# Patient Record
Sex: Female | Born: 1970 | Race: White | Hispanic: Yes | Smoking: Never smoker
Health system: Southern US, Community
[De-identification: ages and names within clinical notes are randomized; demographics above are authoritative.]

## PROBLEM LIST (undated history)

## (undated) DIAGNOSIS — R011 Cardiac murmur, unspecified: Secondary | ICD-10-CM

## (undated) HISTORY — DX: Cardiac murmur, unspecified: R01.1

---

## 1998-09-30 ENCOUNTER — Emergency Department (HOSPITAL_COMMUNITY): Admission: EM | Admit: 1998-09-30 | Discharge: 1998-09-30 | Payer: Self-pay | Admitting: Emergency Medicine

## 1998-10-01 ENCOUNTER — Encounter: Payer: Self-pay | Admitting: Emergency Medicine

## 1998-12-08 ENCOUNTER — Inpatient Hospital Stay (HOSPITAL_COMMUNITY): Admission: AD | Admit: 1998-12-08 | Discharge: 1998-12-08 | Payer: Self-pay | Admitting: Obstetrics & Gynecology

## 1999-02-04 ENCOUNTER — Other Ambulatory Visit: Admission: RE | Admit: 1999-02-04 | Discharge: 1999-02-04 | Payer: Self-pay | Admitting: Gynecology

## 2000-04-28 ENCOUNTER — Other Ambulatory Visit: Admission: RE | Admit: 2000-04-28 | Discharge: 2000-04-28 | Payer: Self-pay | Admitting: Internal Medicine

## 2001-07-17 ENCOUNTER — Other Ambulatory Visit: Admission: RE | Admit: 2001-07-17 | Discharge: 2001-07-17 | Payer: Self-pay | Admitting: Gynecology

## 2002-05-02 ENCOUNTER — Other Ambulatory Visit: Admission: RE | Admit: 2002-05-02 | Discharge: 2002-05-02 | Payer: Self-pay | Admitting: Gynecology

## 2002-05-27 ENCOUNTER — Inpatient Hospital Stay (HOSPITAL_COMMUNITY): Admission: AD | Admit: 2002-05-27 | Discharge: 2002-05-27 | Payer: Self-pay | Admitting: Gynecology

## 2002-05-29 ENCOUNTER — Encounter (INDEPENDENT_AMBULATORY_CARE_PROVIDER_SITE_OTHER): Payer: Self-pay | Admitting: *Deleted

## 2002-05-29 ENCOUNTER — Ambulatory Visit (HOSPITAL_COMMUNITY): Admission: RE | Admit: 2002-05-29 | Discharge: 2002-05-29 | Payer: Self-pay | Admitting: Gynecology

## 2002-06-03 ENCOUNTER — Inpatient Hospital Stay (HOSPITAL_COMMUNITY): Admission: AD | Admit: 2002-06-03 | Discharge: 2002-06-03 | Payer: Self-pay | Admitting: Gynecology

## 2002-06-07 ENCOUNTER — Ambulatory Visit (HOSPITAL_COMMUNITY): Admission: RE | Admit: 2002-06-07 | Discharge: 2002-06-07 | Payer: Self-pay | Admitting: Gynecology

## 2002-06-07 ENCOUNTER — Encounter (INDEPENDENT_AMBULATORY_CARE_PROVIDER_SITE_OTHER): Payer: Self-pay | Admitting: Specialist

## 2002-12-06 ENCOUNTER — Ambulatory Visit (HOSPITAL_COMMUNITY): Admission: RE | Admit: 2002-12-06 | Discharge: 2002-12-06 | Payer: Self-pay | Admitting: Cardiovascular Disease

## 2003-05-21 ENCOUNTER — Other Ambulatory Visit: Admission: RE | Admit: 2003-05-21 | Discharge: 2003-05-21 | Payer: Self-pay | Admitting: Gynecology

## 2003-12-07 ENCOUNTER — Inpatient Hospital Stay (HOSPITAL_COMMUNITY): Admission: AD | Admit: 2003-12-07 | Discharge: 2003-12-09 | Payer: Self-pay | Admitting: Gynecology

## 2004-01-23 ENCOUNTER — Other Ambulatory Visit: Admission: RE | Admit: 2004-01-23 | Discharge: 2004-01-23 | Payer: Self-pay | Admitting: Gynecology

## 2005-02-04 ENCOUNTER — Other Ambulatory Visit: Admission: RE | Admit: 2005-02-04 | Discharge: 2005-02-04 | Payer: Self-pay | Admitting: Gynecology

## 2006-02-15 ENCOUNTER — Other Ambulatory Visit: Admission: RE | Admit: 2006-02-15 | Discharge: 2006-02-15 | Payer: Self-pay | Admitting: Gynecology

## 2006-03-10 ENCOUNTER — Ambulatory Visit (HOSPITAL_COMMUNITY): Admission: RE | Admit: 2006-03-10 | Discharge: 2006-03-10 | Payer: Self-pay | Admitting: Gynecology

## 2007-07-11 ENCOUNTER — Inpatient Hospital Stay (HOSPITAL_COMMUNITY): Admission: AD | Admit: 2007-07-11 | Discharge: 2007-07-13 | Payer: Self-pay | Admitting: Obstetrics and Gynecology

## 2008-05-21 ENCOUNTER — Encounter (HOSPITAL_COMMUNITY): Admission: RE | Admit: 2008-05-21 | Discharge: 2008-05-28 | Payer: Self-pay | Admitting: Family Medicine

## 2008-10-09 ENCOUNTER — Encounter: Admission: RE | Admit: 2008-10-09 | Discharge: 2008-10-09 | Payer: Self-pay | Admitting: Family Medicine

## 2008-12-11 ENCOUNTER — Ambulatory Visit: Payer: Self-pay | Admitting: Internal Medicine

## 2008-12-11 ENCOUNTER — Encounter (INDEPENDENT_AMBULATORY_CARE_PROVIDER_SITE_OTHER): Payer: Self-pay | Admitting: Surgery

## 2008-12-11 ENCOUNTER — Ambulatory Visit (HOSPITAL_COMMUNITY): Admission: RE | Admit: 2008-12-11 | Discharge: 2008-12-12 | Payer: Self-pay | Admitting: Surgery

## 2010-06-21 ENCOUNTER — Encounter: Payer: Self-pay | Admitting: Family Medicine

## 2010-09-06 LAB — COMPREHENSIVE METABOLIC PANEL
ALT: 13 U/L (ref 0–35)
ALT: 25 U/L (ref 0–35)
AST: 18 U/L (ref 0–37)
AST: 31 U/L (ref 0–37)
Albumin: 3.4 g/dL — ABNORMAL LOW (ref 3.5–5.2)
Alkaline Phosphatase: 49 U/L (ref 39–117)
CO2: 23 mEq/L (ref 19–32)
CO2: 27 mEq/L (ref 19–32)
Calcium: 9.1 mg/dL (ref 8.4–10.5)
Chloride: 105 mEq/L (ref 96–112)
Chloride: 108 mEq/L (ref 96–112)
Creatinine, Ser: 0.59 mg/dL (ref 0.4–1.2)
GFR calc Af Amer: >60 mL/min (ref >60)
GFR calc Af Amer: >60 mL/min (ref >60)
GFR calc non Af Amer: >60 mL/min (ref >60)
GFR calc non Af Amer: >60 mL/min (ref >60)
Glucose, Bld: 91 mg/dL (ref 70–99)
Potassium: 4.5 mEq/L (ref 3.5–5.1)
Sodium: 140 mEq/L (ref 135–145)
Total Bilirubin: 0.6 mg/dL (ref 0.3–1.2)
Total Bilirubin: 0.7 mg/dL (ref 0.3–1.2)

## 2010-09-06 LAB — CBC
Hemoglobin: 13.5 g/dL (ref 12.0–15.0)
MCHC: 33.9 g/dL (ref 30.0–36.0)
MCV: 91.8 fL (ref 78.0–100.0)
Platelets: 189 10*3/uL (ref 150–400)
RBC: 4.11 MIL/uL (ref 3.87–5.11)
RBC: 4.33 MIL/uL (ref 3.87–5.11)
WBC: 4.5 10*3/uL (ref 4.0–10.5)
WBC: 7.5 10*3/uL (ref 4.0–10.5)

## 2010-09-06 LAB — DIFFERENTIAL
Basophils Absolute: 0 10*3/uL (ref 0.0–0.1)
Eosinophils Absolute: 0 10*3/uL (ref 0.0–0.7)
Eosinophils Relative: 1 % (ref 0–5)
Lymphocytes Relative: 39 % (ref 12–46)
Neutrophils Relative %: 53 % (ref 43–77)

## 2010-10-13 NOTE — Discharge Summary (Signed)
Katherine Bird, Katherine Bird          ACCOUNT NO.:  [id number]   MEDICAL RECORD NO.:  [id number]          PATIENT TYPE:  INP   LOCATION:  9125                          FACILITY:  [facility]   PHYSICIAN:  Gerrit Friends. Aldona Bar, M.D.   DATE OF BIRTH:  Jan 17, 1971   DATE OF ADMISSION:  07/11/2007  DATE OF DISCHARGE:  07/13/2007                               DISCHARGE SUMMARY   DISCHARGE DIAGNOSES:  1. Term pregnancy, delivered 7 pound 6 ounce female infant, Apgars 9 and      9.  2. Blood type A+.   PROCEDURES:  1. Normal spontaneous delivery.  2. Second-degree tear and repair.   SUMMARY:  This 40 year old gravida 3, para 1 presented at term in labor  after a relatively benign pregnancy.  She requested and received an  epidural and progressed nicely.  Subsequently had a normal spontaneous  delivery of a viable 7 pound 6 ounce female infant with Apgars of 9 and 9  over a second-degree tear which was repaired without difficulty.  Her  postpartum course was benign.  Her discharge hemoglobin was 10.6 with a  white count of 8900, platelet count of 165,000.  On the morning of  July 13, 2007, she was ambulating well, tolerating a regular diet  well, having normal bowel and bladder function, was breast-feeding and  bottle feeding, and was comfortable.  Vital signs were stable and was  desirous of discharge.  Baby was doing well.   DISCHARGE INSTRUCTIONS:  1. She was given all appropriate instructions per discharge brochure      and understood all instructions well.  2. She will return to the office as mentioned in 4 weeks' time or as      needed.   DISCHARGE MEDICATIONS:  1. Vitamins 1 a day as long she is breast-feeding.  2. Feosol capsules 1 a day until her followup in the office in 4      weeks' time.  3. She was given a prescription for Motrin to use 600 mg every 6 hours      for cramping or pain.   CONDITION ON DISCHARGE:  Improved.      Gerrit Friends. Aldona Bar, M.D.  Electronically Signed     RMW/MEDQ  D:  07/13/2007  T:  07/14/2007  Job:  [id number]

## 2010-10-13 NOTE — Op Note (Signed)
Katherine Bird, Katherine Bird          ACCOUNT NO.:  [id number]   MEDICAL RECORD NO.:  [id number]          PATIENT TYPE:  AMB   LOCATION:  DAY                          FACILITY:  [facility]   PHYSICIAN:  Wilmon Arms. Corliss Skains, M.D. DATE OF BIRTH:  Aug 01, 1970   DATE OF PROCEDURE:  12/11/2008  DATE OF DISCHARGE:                               OPERATIVE REPORT   PREOPERATIVE DIAGNOSIS:  Chronic calculous cholecystitis.   POSTOPERATIVE DIAGNOSIS:  Chronic calculous cholecystitis.   PROCEDURE PERFORMED:  Laparoscopic cholecystectomy with intraoperative  cholangiogram.   SURGEON:  Wilmon Arms. Tsuei, M.D.   ANESTHESIA:  General.   INDICATIONS:  This is a 40 year old female who presents with a history  of a severe episode of right upper quadrant abdominal pain associated  with nausea and vomiting.  She has had several milder tacks.  Ultrasound  showed multiple gallstones, the largest measuring 9 mm.  Common bile  duct was prominent.  Preoperative liver function tests however showed a  bilirubin of 0.7.  She presents now for cholecystectomy.   DESCRIPTION OF PROCEDURE:  The patient was brought to the operating and  placed in the supine position on the operating room table.  After  adequate level of general anesthesia was obtained, the patient's abdomen  prepped with ChloraPrep and draped in a sterile fashion.  Time-out was  taken to assure proper patient and proper procedure.  We infiltrated the  area below the umbilicus with 0.25% Marcaine with epinephrine.  A  transverse incision was made.  Dissection was carried down to the  subcutaneous tissues to the fascia.  The fascia was opened vertically.  We bluntly entered the peritoneal cavity.  A stay suture of Vicryl was  placed on the fascial opening.  The Hasson cannula was inserted and  secured with a stay suture.  Pneumoperitoneum was obtained by  insufflating CO2 maintaining maximal pressure of 15 mmHg.  The  laparoscope was inserted and the patient  was positioned in reverse  Trendelenburg tilted to her left.  An 11-mm port was placed in the  subxiphoid position.  Two 5 mm ports were placed in the right upper  quadrant.  The gallbladder was grasped with the clamp and elevated.  There were some flimsy adhesions around the hilum of the gallbladder and  these were taken down bluntly.  We opened the peritoneum around the  hilum of the gallbladder and bluntly dissected around the cystic duct  and the cystic artery.  The cystic artery was ligated with clips and  divided.  The cystic duct was ligated and clipped distally.  A small  opening was created on the cystic duct.  A Cook cholangiogram catheter  was inserted through a stab incision threaded in the cystic duct.  A  cholangiogram was then obtained which showed good flow of contrast  proximally and distally in the biliary tree.  The common bile duct did  seem rather large in size.  There were several filling defects within  the distal common bile duct which were consistent with stones.  This was  confirmed by radiology.  However, contrast did flow into the duodenum.  The  catheter was removed and the cystic duct was ligated with three  clips and divided.  Cautery was then used to remove the gallbladder from  the liver.  The gallbladder was placed in an EndoCatch sac.  We  thoroughly cauterized the gallbladder fossa for hemostasis.  We closed  the umbilical fascia with  the pursestring suture.  Pneumoperitoneum was then released as we  removed the remained of the trocars.  4-0 Monocryl was used to close the  skin incisions.  Steri-Strips and clean dressings were applied.  The  patient was extubated and brought to recovery in stable condition.  All  sponge, instrument, and needle counts were correct.      Wilmon Arms. Tsuei, M.D.  Electronically Signed     MKT/MEDQ  D:  12/11/2008  T:  12/11/2008  Job:  [id number]

## 2010-10-16 NOTE — Op Note (Signed)
   NAME:  GERALDY, AKRIDGE                    ACCOUNT NO.:  [id number]   MEDICAL RECORD NO.:  [id number]                   PATIENT TYPE:  AMB   LOCATION:  SDC                                  FACILITY:  [facility]   PHYSICIAN:  Katy Fitch, M.D.               DATE OF BIRTH:  Nov 23, 1970   DATE OF PROCEDURE:  05/29/2002  DATE OF DISCHARGE:                                 OPERATIVE REPORT   PREOPERATIVE DIAGNOSES:  Six week missed abortion.   POSTOPERATIVE DIAGNOSES:  Six week missed abortion.   PROCEDURE:  Suction dilatation and curettage.   SURGEON:  Katy Fitch, M.D.   ANESTHESIA:  MAC.   ESTIMATED BLOOD LOSS:  Minimal.   URINE OUTPUT:  250 and clear.   FLUIDS:  600 crystalloid.   COMPLICATIONS:  None.   SPECIMENS:  Products of conception.   FINDINGS:  A 6 week sized uterus.   PROCEDURE:  The patient was taken to the operating room where MAC anesthesia  was administered.  The patient was prepped and draped in a normal sterile  fashion.  Red rubber catheter was introduced in the bladder and  approximately 250 cc of clear urine was returned.  Graves' speculum was  placed into the vagina.  Anterior lip of the cervix grasped with single  tooth tenaculum.  A paracervical block was then performed using 2% lidocaine  without epinephrine.  The uterus was then sounded to approximately 7 cm.  The cervix was then serially dilated to approximately 24 [place].  A number 7  tip curved was then placed in the uterine fundus and two passes of the  suction were performed with products noted to be removed.  A sharp curette  was then used to obtain a gritty texture in the uterus.  One final pass of  the suction curette was then performed.  Bleeding was minimal.  The single  tooth tenaculum was removed along with the Graves' speculum and patient was  taken to recovery room in stable condition.                                               Katy Fitch, M.D.    DC/MEDQ  D:   05/29/2002  T:  05/29/2002  Job:  [id number]

## 2010-10-16 NOTE — Discharge Summary (Signed)
NAME:  Katherine Bird, Katherine Bird                    ACCOUNT NO.:  [id number]   MEDICAL RECORD NO.:  [id number]                   PATIENT TYPE:  INP   LOCATION:  9128                                 [facility]ACILITY:  [facility]   PHYSICIAN:  Juan H. Lily Peer, M.D.             DATE OF BIRTH:  13-Oct-1970   DATE OF ADMISSION:  12/07/2003  DATE OF DISCHARGE:  12/09/2003                                 DISCHARGE SUMMARY   HISTORY OF PRESENT ILLNESS:  The patient is a 40 year old gravida 2, para 0,  abortus 1, who was admitted on March 9 complaining of onset of labor that  day.  She was found to be contracting every 2-3 minutes apart with a  reassuring fetal heart rate tracing and occasional decelerations and her  cervix was found to be 5 cm, 90% effaced, 0 station.  She underwent  artificial rupture of membranes.  She also has a history of VSD and was  placed on ampicillin IV for prophylaxis.  She had amnioinfusion,  subsequently went on to rapidly deliver at 6:12 p.m. on that day a viable  female infant with Apgars of 8 and 9 with weight of 7 pounds 3 ounces.  Arterial cord pH was 7.27 and the patient had sustained a fourth degree tear  repair in standard repair fashion.  Postpartum day #1, her hemoglobin was  11.9, blood type A positive, rubella immune.  On her second postoperative  day, she continued to remain afebrile and was ready to be discharged home.  Her fundal height was two fingerbreadths under the umbilicus.  The uterus  was firm.  The lochia was scant.   DISCHARGE MEDICATIONS:  1. Motrin 800 mg t.i.d. p.r.n.  2. Tylox 1-2 tablets p.o. q.4-6h. p.r.n. pain.  3. She was instructed to continue her prenatal vitamins.   FOLLOWUP:  She was to follow up in [facility] office  in 6 weeks, and instruction booklet was provided.  All of the above was  discussed with the patient and she will follow up accordingly.                                               Juan H. Lily Peer,  M.D.    JHF/MEDQ  D:  12/09/2003  T:  12/09/2003  Job:  [id number]

## 2010-10-16 NOTE — Consult Note (Signed)
   NAME:  Katherine Bird, Katherine Bird                    ACCOUNT NO.:  [id number]   MEDICAL RECORD NO.:  [id number]                   PATIENT TYPE:  MAT   LOCATION:  MATC                                 [facility]   PHYSICIAN:  Juan H. Lily Peer, M.D.             DATE OF BIRTH:  Mar 31, 1971   DATE OF CONSULTATION:  DATE OF DISCHARGE:                                   CONSULTATION   HISTORY OF PRESENT ILLNESS:  The patient is a 40 year old Gravida I, Para 0  who's estimated gestational age is 7 weeks by ultrasound done in the office.  The patient's ultrasound last week was done due to the fact that fetal heart  tones were not auscultated at the 10 to 12 week window. Her records are not  available at this time but from my recollection, she had an irregular  gestational sac at that point but she was concerned of whether her dates  were incorrect or not and wanted to wait until next week to get an  ultrasound to confirm indeed that she has a missed AB.   HOSPITAL COURSE:  She presented to [facility] today complaining of a  dark brownish discharge. No cramping. No fever, chills, nausea and vomiting.  Speculum examination demonstrated some old blood in the vaginal area but the  cervix was closed and there was no active bleeding. We optioned her to have  an ultrasound today. She stated that she preferred to wait until next week  to give a few more days and to re-confirm indeed, that she does have a  missed AB. She was instructed to return to the office or to the emergency  room if she starts having bright red bleeding.   LABORATORY DATA:  Her CBC today demonstrated WBC count of 6.9, hemoglobin  and hematocrit 13.5 and 39.3 respectively with platelet count of 222,000.  Blood type was A+.   DISPOSITION:  All instructions were provided to the patient in Spanish.  Otherwise, we will plan to follow-up with the ultrasound next week and once  again, if her dates are still correct and there is  evidence of a missed AB,  then she will agree to proceed with a D&E.                                               Juan H. Lily Peer, M.D.    JHF/MEDQ  D:  05/27/2002  T:  05/27/2002  Job:  [id number]

## 2010-10-16 NOTE — H&P (Signed)
NAME:  Katherine Bird, Katherine Bird                    ACCOUNT NO.:  [id number]   MEDICAL RECORD NO.:  [id number]                   PATIENT TYPE:  AMB   LOCATION:  SDC                                  [facility]   PHYSICIAN:  Juan H. Lily Peer, M.D.             DATE OF BIRTH:  Oct 24, 1970   DATE OF ADMISSION:  06/07/2002  DATE OF DISCHARGE:                                HISTORY & PHYSICAL   CHIEF COMPLAINT:  Retained products of conception.   HISTORY OF PRESENT ILLNESS:  The patient is a 40 year old gravida 1 para 0  whose last menstrual period had been February 24, 2002 and estimated date  of confinement was to be December 01, 2002.  The patient had been seen at  [facility] for a prenatal visit.  No fetal heart tones  appreciated at the 12-week visit and she opted to wait an additional week  before a D&E.  To reconfirm she had another ultrasound which was done on  December 30, reconfirming once again that there was a gestational sac,  empty, consistent with a blighted ovum.  She subsequently had undergone a  suction D&E on December 30 but the patient report demonstrated inflamed  decidua without evidence of villi on evaluation.  She returned back to the  office.  Interesting enough, her quantitative beta hCGs have remained more  or less leveled off; the one on January 2 was 6060 and on January 5 was  5644.  She had an ultrasound done in the office on January 6 which  demonstrated a uterus measuring 10.5 x 5.1 x 6.7 cm with irregular shaped  sac, mid cavity measuring  28 x 9 x 27 mm thickened decidual reaction, increased vascular flow to the  anterior endometrial wall, and linear echogenic focus in mid sac.  The right  and left ovaries were normal with the exception of a small corpus luteum  cyst on the right ovary measuring 16 x 15 mm.  There was no fluid in the cul-  de-sac.  Findings were consistent with retained products of conception.  The  patient's blood type  is A positive.  She is being taken back to the  operating room for a suction D&E.   PAST MEDICAL HISTORY:  As described above.  She denies any allergies.  She  has otherwise been healthy.   REVIEW OF SYSTEMS:  Essentially unremarkable with the exception that she did  have a systolic ejection murmur 3/6 that had not been noted during her  pregnancy, and subsequently had been referred to a cardiologist here in  [place], of which no report was returned back on written form but her  husband stated that there was no abnormality on echocardiogram.  Still at  this time we are waiting for a consultation report from the cardiologist.  She has otherwise been asymptomatic.   PHYSICAL EXAMINATION:  GENERAL:  Well-developed, well-nourished female.  HEENT:  Unremarkable.  NECK:  Supple, trachea midline.  No carotid bruits, no thyromegaly.  HEART:  A grade 3/6 systolic ejection murmur, precordial region.  BREASTS:  Not examined.  ABDOMEN:  Soft, nontender, without rebound or guarding.  PELVIC:  Bartholin's, urethral, Skene glands within normal limits.  Vagina  with dark red blood in the vault.  Cervix minimally dilated, no active  bleeding noted.  Uterus approximately 10 weeks size, no palpable adnexal  masses, no rebound or tenderness.  RECTAL:  Exam was not done.   PRENATAL LABORATORY DATA:  Blood type A positive, negative antibody screen.  VDRL nonreactive.  Hepatitis B surface antigen and HIV were negative.  Rubella was immune.   ASSESSMENT:  A 40 year old gravida 1 para 0 at 12-and-a-half weeks by last  menstrual period, 10 weeks by ultrasound measurement of the uterus;  persistent gestational sac with products of conception despite previous D&E;  and pathology report demonstrating no chorionic villi.  The patient is being  taken to the operating room for a D&E secondary to retained products of  conception.  Blood type is A positive.  Risks, benefits, pros, and cons of  procedure were  discussed, and all this was explained to the patient and her  husband as well in [place].   PLAN:  Suction D&E today, June 07, 2002 at [facility].                                               [place]. Lily Peer, M.D.    JHF/MEDQ  D:  06/07/2002  T:  06/07/2002  Job:  [id number]

## 2010-10-16 NOTE — Op Note (Signed)
   NAME:  Katherine Bird, Katherine Bird                    ACCOUNT NO.:  [id number]   MEDICAL RECORD NO.:  [id number]                   PATIENT TYPE:  AMB   LOCATION:  SDC                                  [facility]   PHYSICIAN:  Juan H. Lily Peer, M.D.             DATE OF BIRTH:  05-19-1971   DATE OF PROCEDURE:  06/07/2002  DATE OF DISCHARGE:                                 OPERATIVE REPORT   SURGEON:  Juan H. Lily Peer, M.D.   INDICATION FOR OPERATION:  A 36 year old gravida 1, para 0 who had a first  trimester missed AB and subsequently had undergone a D&C on December 30.  Pathology report demonstrated no products of conception.  Follow-up  ultrasound in the office had demonstrated retained products of conception.  The patient's blood type A+.   PREOPERATIVE DIAGNOSES:  Retained products of conception.   POSTOPERATIVE DIAGNOSES:  Retained products of conception.   ANESTHESIA:  MAC along with a paracervical block consisting of 2% Xylocaine  with 1:100,000 epinephrine.   PROCEDURE PERFORMED:  Dilatation and evacuation.   DESCRIPTION OF OPERATION:  After the patient was adequately counseled, she  was taken to the operating room.  She underwent intravenous sedation.  She  was placed in low lithotomy position.  The bladder was evacuated of its  contents for approximately 50 cubic centimeters.  Bimanual examination  demonstrated 10 week sized uterus, anteverted with no palpable adnexal  masses.  The vagina and perineum were prepped and draped in the usual  sterile fashion.  Graves' speculum was inserted into the vaginal vault.  2%  Xylocaine with 1:100,000 epinephrine was infiltrated into the cervical  stroma at the 2, 4, 8, and 10 o'clock position.  The cervix was serially  dilated to a 27 mm size Pratt dilator followed by insertion of the 7 mm  suction curette.  This was interchanged several times with the serrated  curette to remove the products of conception.  The patient received 2  g of  Cefotan IV.  Received 30 mg of Toradol en route to the recovery room.  Vital  signs were stable.  Blood loss was less than 50 cubic centimeters.  Postoperative in-and-out catheter approximately 15 cubic centimeters of  clear urine.  The patient's blood type is A+.                                               Juan H. Lily Peer, M.D.    JHF/MEDQ  D:  06/07/2002  T:  06/07/2002  Job:  [id number]

## 2011-01-28 ENCOUNTER — Other Ambulatory Visit (HOSPITAL_COMMUNITY): Payer: Self-pay | Admitting: Obstetrics and Gynecology

## 2011-02-19 LAB — CBC
Hemoglobin: 13.2
MCHC: 34.4
MCV: 90.5
Platelets: 165
Platelets: 195
RDW: 14.3
WBC: 8.9

## 2011-02-19 LAB — RPR: RPR Ser Ql: NONREACTIVE

## 2011-03-09 ENCOUNTER — Other Ambulatory Visit (HOSPITAL_COMMUNITY): Payer: Self-pay | Admitting: Obstetrics and Gynecology

## 2011-03-09 DIAGNOSIS — Z1231 Encounter for screening mammogram for malignant neoplasm of breast: Secondary | ICD-10-CM

## 2011-03-24 ENCOUNTER — Ambulatory Visit (HOSPITAL_COMMUNITY)
Admission: RE | Admit: 2011-03-24 | Discharge: 2011-03-24 | Disposition: A | Discharge status: Home / Self Care | Payer: Self-pay | Source: Ambulatory Visit | Attending: Obstetrics and Gynecology | Admitting: Obstetrics and Gynecology

## 2011-03-24 DIAGNOSIS — Z1231 Encounter for screening mammogram for malignant neoplasm of breast: Secondary | ICD-10-CM | POA: Insufficient documentation

## 2011-04-02 ENCOUNTER — Other Ambulatory Visit: Payer: Self-pay | Admitting: Obstetrics and Gynecology

## 2011-04-02 DIAGNOSIS — R928 Other abnormal and inconclusive findings on diagnostic imaging of breast: Secondary | ICD-10-CM

## 2011-04-07 ENCOUNTER — Other Ambulatory Visit: Payer: Self-pay | Admitting: Obstetrics and Gynecology

## 2011-04-15 ENCOUNTER — Other Ambulatory Visit: Payer: Self-pay

## 2011-05-03 ENCOUNTER — Ambulatory Visit
Admission: RE | Admit: 2011-05-03 | Discharge: 2011-05-03 | Disposition: A | Discharge status: Home / Self Care | Payer: No Typology Code available for payment source | Source: Ambulatory Visit | Attending: Obstetrics and Gynecology | Admitting: Obstetrics and Gynecology

## 2011-05-03 DIAGNOSIS — R928 Other abnormal and inconclusive findings on diagnostic imaging of breast: Secondary | ICD-10-CM

## 2012-02-11 ENCOUNTER — Other Ambulatory Visit: Payer: Self-pay | Admitting: Obstetrics and Gynecology

## 2012-02-11 DIAGNOSIS — D249 Benign neoplasm of unspecified breast: Secondary | ICD-10-CM

## 2012-02-17 ENCOUNTER — Ambulatory Visit
Admission: RE | Admit: 2012-02-17 | Discharge: 2012-02-17 | Disposition: A | Discharge status: Home / Self Care | Payer: Self-pay | Source: Ambulatory Visit | Attending: Obstetrics and Gynecology | Admitting: Obstetrics and Gynecology

## 2012-02-17 DIAGNOSIS — D249 Benign neoplasm of unspecified breast: Secondary | ICD-10-CM

## 2012-07-27 ENCOUNTER — Other Ambulatory Visit: Payer: Self-pay

## 2012-09-21 ENCOUNTER — Other Ambulatory Visit: Payer: Self-pay | Admitting: Obstetrics and Gynecology

## 2012-09-21 DIAGNOSIS — N649 Disorder of breast, unspecified: Secondary | ICD-10-CM

## 2012-10-04 ENCOUNTER — Ambulatory Visit
Admission: RE | Admit: 2012-10-04 | Discharge: 2012-10-04 | Disposition: A | Discharge status: Home / Self Care | Payer: No Typology Code available for payment source | Source: Ambulatory Visit | Attending: Obstetrics and Gynecology | Admitting: Obstetrics and Gynecology

## 2012-10-04 ENCOUNTER — Ambulatory Visit
Admission: RE | Admit: 2012-10-04 | Discharge: 2012-10-04 | Disposition: A | Discharge status: Home / Self Care | Payer: Self-pay | Source: Ambulatory Visit | Attending: Obstetrics and Gynecology | Admitting: Obstetrics and Gynecology

## 2012-10-04 DIAGNOSIS — N649 Disorder of breast, unspecified: Secondary | ICD-10-CM

## 2013-05-03 ENCOUNTER — Other Ambulatory Visit: Payer: Self-pay | Admitting: Obstetrics and Gynecology

## 2013-05-03 DIAGNOSIS — N649 Disorder of breast, unspecified: Secondary | ICD-10-CM

## 2013-05-10 ENCOUNTER — Ambulatory Visit
Admission: RE | Admit: 2013-05-10 | Discharge: 2013-05-10 | Disposition: A | Discharge status: Home / Self Care | Payer: No Typology Code available for payment source | Source: Ambulatory Visit | Attending: Obstetrics and Gynecology | Admitting: Obstetrics and Gynecology

## 2013-05-10 DIAGNOSIS — N649 Disorder of breast, unspecified: Secondary | ICD-10-CM

## 2014-01-10 ENCOUNTER — Other Ambulatory Visit: Payer: Self-pay

## 2014-01-11 ENCOUNTER — Other Ambulatory Visit: Payer: Self-pay

## 2014-01-11 DIAGNOSIS — Z1231 Encounter for screening mammogram for malignant neoplasm of breast: Secondary | ICD-10-CM

## 2014-01-23 ENCOUNTER — Ambulatory Visit
Admission: RE | Admit: 2014-01-23 | Discharge: 2014-01-23 | Disposition: A | Discharge status: Home / Self Care | Payer: No Typology Code available for payment source | Source: Ambulatory Visit

## 2014-01-23 DIAGNOSIS — Z1231 Encounter for screening mammogram for malignant neoplasm of breast: Secondary | ICD-10-CM

## 2014-01-25 ENCOUNTER — Other Ambulatory Visit: Payer: Self-pay | Admitting: Internal Medicine

## 2014-01-25 DIAGNOSIS — R928 Other abnormal and inconclusive findings on diagnostic imaging of breast: Secondary | ICD-10-CM

## 2014-02-01 ENCOUNTER — Ambulatory Visit
Admission: RE | Admit: 2014-02-01 | Discharge: 2014-02-01 | Disposition: A | Discharge status: Home / Self Care | Payer: No Typology Code available for payment source | Source: Ambulatory Visit | Attending: Internal Medicine | Admitting: Internal Medicine

## 2014-02-01 DIAGNOSIS — R928 Other abnormal and inconclusive findings on diagnostic imaging of breast: Secondary | ICD-10-CM

## 2015-02-26 ENCOUNTER — Other Ambulatory Visit: Payer: Self-pay

## 2015-02-26 DIAGNOSIS — Z1231 Encounter for screening mammogram for malignant neoplasm of breast: Secondary | ICD-10-CM

## 2015-03-05 ENCOUNTER — Ambulatory Visit
Admission: RE | Admit: 2015-03-05 | Discharge: 2015-03-05 | Disposition: A | Discharge status: Home / Self Care | Payer: No Typology Code available for payment source | Source: Ambulatory Visit

## 2015-03-05 DIAGNOSIS — Z1231 Encounter for screening mammogram for malignant neoplasm of breast: Secondary | ICD-10-CM

## 2016-01-30 ENCOUNTER — Other Ambulatory Visit: Payer: Self-pay | Admitting: Gynecology

## 2016-01-30 DIAGNOSIS — Z1231 Encounter for screening mammogram for malignant neoplasm of breast: Secondary | ICD-10-CM

## 2016-02-05 ENCOUNTER — Ambulatory Visit (INDEPENDENT_AMBULATORY_CARE_PROVIDER_SITE_OTHER): Payer: Self-pay | Admitting: Gynecology

## 2016-02-05 ENCOUNTER — Encounter: Payer: Self-pay | Admitting: Gynecology

## 2016-02-05 VITALS — BP 126/76 | Ht 61.5 in | Wt 135.0 lb

## 2016-02-05 DIAGNOSIS — Z23 Encounter for immunization: Secondary | ICD-10-CM

## 2016-02-05 DIAGNOSIS — Q210 Ventricular septal defect: Secondary | ICD-10-CM

## 2016-02-05 DIAGNOSIS — Z01419 Encounter for gynecological examination (general) (routine) without abnormal findings: Secondary | ICD-10-CM

## 2016-02-05 NOTE — Addendum Note (Signed)
Addended by: Thurnell Garbe A on: 02/05/2016 11:14 AM   Modules accepted: Orders

## 2016-02-05 NOTE — Patient Instructions (Addendum)
Influenza Virus Vaccine (Flucelvax) Qu es este medicamento? La VACUNA ANTIGRIPAL ayuda a disminuir el riesgo de contraer la influenza, tambin conocida como la gripe. La vacuna solo ayuda a protegerle contra algunas cepas de influenza. Este medicamento puede ser utilizado para otros usos; si tiene alguna pregunta consulte con su proveedor de atencin mdica o con su farmacutico. Qu le debo informar a mi profesional de la salud antes de tomar este medicamento? Necesita saber si usted presenta alguno de los siguientes problemas o situaciones: -trastorno de sangrado como hemofilia -fiebre o infeccin -sndrome de Guillain-Barre u otros problemas neurolgicos -problemas del sistema inmunolgico -infeccin por el virus de la inmunodeficiencia humana (VIH) o SIDA -niveles bajos de plaquetas en la sangre -esclerosis mltiple -una reaccin alrgica o inusual a las vacunas antigripales, a otros medicamentos, alimentos, colorantes o conservantes -si est embarazada o buscando quedar embarazada -si est amamantando a un beb Cmo debo utilizar este medicamento? Esta vacuna se administra mediante inyeccin por va intramuscular. Lo administra un profesional de [employer]	[place]. Recibir una copia de informacin escrita sobre la vacuna antes de cada vacuna. Asegrese de leer este folleto cada vez cuidadosamente. Este folleto puede cambiar con frecuencia. Hable con su pediatra para informarse acerca del uso de este medicamento en nios. Puede requerir atencin especial. Sobredosis: Pngase en contacto inmediatamente con un centro toxicolgico o una sala de urgencia si usted cree que haya tomado demasiado medicamento. ATENCIN: [employer] es solo para usted. No comparta este medicamento con nadie. Qu sucede si me olvido de una dosis? No se aplica en este caso. Qu puede interactuar con este medicamento? -quimioterapia o radioterapia -medicamentos que suprimen el sistema inmunolgico, tales como  etanercept, anakinra, infliximab y adalimumab -medicamentos que tratan o previenen cogulos sanguneos, como warfarina -fenitona -medicamentos esteroideos, como la prednisona o la cortisona -teofilina -vacunas Puede ser que esta lista no menciona todas las posibles interacciones. Informe a su profesional de [employer]	[place] de [employer] productos a base de hierbas, medicamentos de [place] o suplementos nutritivos que est tomando. Si usted fuma, consume bebidas alcohlicas o si utiliza drogas ilegales, indqueselo tambin a su profesional de [employer]	[place]. Algunas sustancias pueden interactuar con su medicamento. A qu debo estar atento al usar [employer]? Informe a su mdico o a Barrister's clerk de la [employer] todos los efectos secundarios que persistan despus de 3 das. Llame a su proveedor de atencin mdica si se presentan sntomas inusuales dentro de las 6 semanas de recibir esta vacuna. Es posible que todava pueda contraer la gripe, pero la enfermedad no ser tan fuerte como normalmente. No puede contraer la gripe de esta vacuna. La vacuna antigripal no le protege contra resfros u otras enfermedades que pueden causar [place]. Debe vacunarse cada ao. Qu efectos secundarios puedo tener al [employer] este medicamento? Efectos secundarios que debe informar a su mdico o a Barrister's clerk de la salud tan pronto como sea posible: -reacciones alrgicas como erupcin cutnea, picazn o urticarias, hinchazn de la cara, labios o lengua Efectos secundarios que, por lo general, no requieren atencin mdica (debe informarlos a su mdico o a su profesional de la salud si persisten o si son molestos): -fiebre -dolor de cabeza -molestias y dolores musculares -dolor, sensibilidad, enrojecimiento o Estate agent de la inyeccin -cansancio Puede ser que esta lista no menciona todos los posibles efectos secundarios. Comunquese a su mdico por asesoramiento mdico [employer]. Usted  puede informar los efectos secundarios a la FDA por telfono al 1-800-FDA-1088. Dnde  debo guardar mi medicina? Esta vacuna se administrar por un profesional de la salud en una [place], Engineer, mining, consultorio mdico u otro consultorio de un profesional de la salud. No se le suministrar esta vacuna para guardar en su domicilio. ATENCIN: Este folleto es un resumen. Puede ser que no cubra toda la posible informacin. Si usted tiene preguntas acerca de esta medicina, consulte con su mdico, su farmacutico o su profesional de Technical sales engineer.    2016, Elsevier/Gold Standard. (2011-05-03 16:29:16) Informacin sobre el dispositivo intrauterino (Intrauterine Device Information) Un dispositivo intrauterino (DIU) se inserta en el tero e impide el embarazo. Hay dos tipos de DIU:   DIU de cobre: este tipo de DIU est recubierto con un alambre de cobre y se inserta dentro del tero. El cobre hace que el tero y las trompas de [place] produzcan un liquido que [employer] espermatozoides. El DIU de cobre puede Nutritional therapist durante 10 aos.  DIU con hormona: este tipo de DIU contiene la hormona progestina (progesterona sinttica). Las hormonas hacen que el moco cervical se haga ms espeso, lo que evita que el esperma ingrese al tero. Tambin hace que la membrana que recubre internamente al tero sea ms delgada lo que impide el implante del vulo fertilizado. La hormona debilita o destruye los espermatozoides que ingresan al tero. Alguno de los tipos de DIU hormonal pueden Nutritional therapist durante 5 aos y otros tipos pueden dejarse en el lugar por 3 aos. El mdico se asegurar de que usted sea una buena candidata para usar el DIU. Converse con su mdico acerca de los posibles efectos secundarios.  VENTAJAS DEL [place] es muy eficaz, reversible, de accin prolongada y de bajo mantenimiento.  No hay efectos secundarios relacionados con el estrgeno.  El DIU puede ser  utilizado durante la Transport planner.  No est asociado con el aumento de [place].  Funciona inmediatamente despus de la insercin.  El DIU hormonal funciona inmediatamente si se inserta dentro de los 7 das del inicio del perodo. Ser necesario que utilice un mtodo anticonceptivo adicional durante 7 das si el DIU hormonal se inserta en algn otro momento del ciclo.  El DIU de cobre no interfiere con las hormonas femeninas.  El DIU hormonal puede hacer que los perodos menstruales abundantes se hagan ms ligeros y que haya menos clicos.  El DIU hormonal puede usarse durante 3 a 5 aos.  El DIU de cobre puede usarse durante 10 aos. DESVENTAJAS DEL DISPOSITIVO INTRAUTERINO  El DIU hormonal puede estar asociado con patrones de sangrado irregular.  El DIU de cobre puede hacer que el flujo menstrual ms abundante y doloroso.  Puede experimentar clicos y sangrado vaginal despus de la insercin.   Esta informacin no tiene Marine scientist el consejo del mdico. Asegrese de hacerle al mdico cualquier pregunta que tenga.   Document Released: 11/04/2009 Document Revised: 01/17/2013 Elsevier Interactive Patient Education [employer].

## 2016-02-05 NOTE — Progress Notes (Signed)
Katherine Bird 1970-09-22 [id number]   History:    45 y.o.  for annual gyn exam who has not been seen in the office since 2008. She reports a normal Pap smear approximate 3 years ago. She had been on Nexplanon for contraception and no longer and is using condoms for contraception. She reports normal cycles every 28 days which last for partially 3 days. Patient interested in flu vaccine today. Patient with no previous history of any abnormal Pap smear. Patient wanted discuss alternative forms of contraception.  Past medical history,surgical history, family history and social history were all reviewed and documented in the EPIC chart.  Gynecologic History Patient's last menstrual period was 01/23/2016. Contraception: condoms Last Pap: Over 3 years ago. Results were: normal Last mammogram: 2016. Results were: Normal but dense  Obstetric History OB History  Gravida Para Term Preterm AB Living  3 2     1 2   SAB TAB Ectopic Multiple Live Births               # Outcome Date GA Lbr Len/2nd Weight Sex Delivery Anes PTL Lv  3 AB           2 Para           1 Para                ROS: A ROS was performed and pertinent positives and negatives are included in the history.  GENERAL: No fevers or chills. HEENT: No change in vision, no earache, sore throat or sinus congestion. NECK: No pain or stiffness. CARDIOVASCULAR: No chest pain or pressure. No palpitations. PULMONARY: No shortness of breath, cough or wheeze. GASTROINTESTINAL: No abdominal pain, nausea, vomiting or diarrhea, melena or bright red blood per rectum. GENITOURINARY: No urinary frequency, urgency, hesitancy or dysuria. MUSCULOSKELETAL: No joint or muscle pain, no back pain, no recent trauma. DERMATOLOGIC: No rash, no itching, no lesions. ENDOCRINE: No polyuria, polydipsia, no heat or cold intolerance. No recent change in weight. HEMATOLOGICAL: No anemia or easy bruising or bleeding. NEUROLOGIC: No headache, seizures, numbness,  tingling or weakness. PSYCHIATRIC: No depression, no loss of interest in normal activity or change in sleep pattern.     Exam: chaperone present  BP 126/76   Ht 5' 1.5" (1.562 m)   Wt 135 lb (61.2 kg)   LMP 01/23/2016   BMI 25.09 kg/m   Body mass index is 25.09 kg/m.  General appearance : Well developed well nourished female. No acute distress HEENT: Eyes: no retinal hemorrhage or exudates,  Neck supple, trachea midline, no carotid bruits, no thyroidmegaly Lungs: Clear to auscultation, no rhonchi or wheezes, or rib retractions  Heart: Regular rate and rhythm, no murmurs or gallops Breast:Examined in sitting and supine position were symmetrical in appearance, no palpable masses or tenderness,  no skin retraction, no nipple inversion, no nipple discharge, no skin discoloration, no axillary or supraclavicular lymphadenopathy Abdomen: no palpable masses or tenderness, no rebound or guarding Extremities: no edema or skin discoloration or tenderness  Pelvic:  Bartholin, Urethra, Skene Glands: Within normal limits             Vagina: No gross lesions or discharge  Cervix: No gross lesions or discharge  Uterus  anteverted, normal size, shape and consistency, non-tender and mobile  Adnexa  Without masses or tenderness  Anus and perineum  normal   Rectovaginal  normal sphincter tone without palpated masses or tenderness  Hemoccult not indicated     Assessment/Plan:  45 y.o. female for annual exam who wanted to discuss forms of contraception interested in the [place] IUD for which literature information was provided. I'm going to place a call with her cardiologist to see if there is any contraindication to the fact that she's had a past history of a small VSD. When she comes for the IUD placement she'll come in a fasting state to that we can do her fasting blood work consisting of the following: Fasting lipid profile, comprehensive metabolic panel, TSH, CBC, and urinalysis. Pap  smear was done today. Patient requesting flu vaccine today for which she was counseled and literature information was provided. Patient is due for her mammogram later this year and was reminded to request a 3-D mammogram due to the consistency of her breast and dense.   Terrance Mass MD, 10:37 AM 02/05/2016

## 2016-02-05 NOTE — Addendum Note (Signed)
Addended by: Joaquin Music on: 02/05/2016 12:32 PM   Modules accepted: Orders

## 2016-02-06 LAB — PAP IG W/ RFLX HPV ASCU

## 2016-02-18 ENCOUNTER — Encounter: Payer: Self-pay | Admitting: Anesthesiology

## 2016-02-18 ENCOUNTER — Ambulatory Visit (INDEPENDENT_AMBULATORY_CARE_PROVIDER_SITE_OTHER): Payer: Self-pay | Admitting: Gynecology

## 2016-02-18 ENCOUNTER — Encounter: Payer: Self-pay | Admitting: Gynecology

## 2016-02-18 VITALS — BP 118/76

## 2016-02-18 DIAGNOSIS — Z975 Presence of (intrauterine) contraceptive device: Secondary | ICD-10-CM | POA: Insufficient documentation

## 2016-02-18 DIAGNOSIS — Z30430 Encounter for insertion of intrauterine contraceptive device: Secondary | ICD-10-CM

## 2016-02-18 NOTE — Progress Notes (Signed)
   Patient is a 45 year old that presented to the office today for placement of the [place] IUD. She was seen the office for her annual exam on September 7 see previous note for detail. Literature information been provided. She is on her third day of her cycle.                                                                    IUD procedure note       Patient presented to the office today for placement of Katherine Bird  IUD. The patient had previously been provided with literature information on this method of contraception. The risks benefits and pros and cons were discussed and all her questions were answered. She is fully aware that this form of contraception is 99% effective and is good for 3 years.  Pelvic exam: Bartholin urethra Skene glands: Within normal limits Vagina: No lesions or discharge Cervix: No lesions or discharge Uterus: Anteverted position Adnexa: No masses or tenderness Rectal exam: Not done  The cervix was cleansed with Betadine solution. A single-tooth tenaculum was placed on the anterior cervical lip. The uterus sounded to 7-1/2 centimeter. The IUD was shown to the patient and inserted in a sterile fashion. The IUD string was trimmed. The single-tooth tenaculum was removed. Patient was instructed to return back to the office in one month for follow up.

## 2016-02-18 NOTE — Patient Instructions (Signed)
Colocación de un dispositivo intrauterino - Cuidados posteriores  (Intrauterine Device Insertion, Care After)  Siga estas instrucciones durante las próximas semanas. Estas indicaciones le proporcionan información general acerca de cómo deberá cuidarse después del procedimiento. El médico también podrá darle instrucciones más específicas. El tratamiento ha sido planificado según las prácticas médicas actuales, pero en algunos casos pueden ocurrir problemas. Comuníquese con el médico si tiene algún problema o tiene dudas después del procedimiento.  QUÉ ESPERAR DESPUÉS DEL PROCEDIMIENTO  La inserción del DIU puede causar molestias, como cólicos. que deberían mejorar una vez que el DIU esté en su lugar. Podrá tener sangrado después del procedimiento. Esto es normal. Varía desde un sangrado ligero durante un par de días hasta un sangrado similar al menstrual. Cuando el DIU esté en su lugar, se extenderá un hilo de 1 a 2 pulgadas (2,5 a 5 cm) por el cuello del útero en la vagina. El hilo no debería molestarle a usted ni a su pareja. De lo contrario, consulte con su médico.   INSTRUCCIONES PARA EL CUIDADO EN EL HOGAR   · Controle su DIU para asegurarse de que esté en su lugar, antes de reanudar la actividad sexual. Tiene que sentir los hilos. Si no los siente, algo puede estar mal. El DIU puede haberse salido del útero o éste puede haber sido atravesado (perforado) durante la colocación. Además, si los hilos son más largos, puede significar que el DIU se está saliendo del útero. Si ocurre alguno de estos problemas, no estará protegida y podrá quedar embarazada.  · Puede volver a tener relaciones sexuales si no tiene problemas con el DIU. El DIU de cobre se considera efectivo y funciona de inmediato, si se inserta dentro de los 7 días del inicio del período. Será necesario que utilice un método anticonceptivo adicional durante 7 días, si el DIU se inserta en algún otro momento del ciclo.  · Controle que el DIU sigue en su  lugar sintiendo los hilos después de cada período menstrual.  · Es posible que necesite tomar analgésicos, como acetaminofeno o ibuprofeno. Tome todos los medicamentos como le indicó el médico.  SOLICITE ATENCIÓN MÉDICA SI:   · Tiene un sangrado más abundante o dura más de un ciclo menstrual normal.  · Tiene fiebre.  · Siente cólicos o dolor abdominal que no se alivian con medicamentos.  · Siente dolor abdominal que no parece estar relacionado con el área en que sentía los cólicos y el dolor anteriormente.  · Se siente mareada, inusualmente débil o se desmaya.  · Tiene flujo vaginal u olores anormales.  · Siente dolor durante las relaciones sexuales.  · No puede sentir los hilos del DIU o los siente más largos.  · Siente que el DIU está en la abertura del cuello del útero, en la vagina.  · Piensa que está embarazada o no tiene su período menstrual.  · El hilo del DIU está lastimando a su pareja sexual.  ASEGÚRESE DE QUE:  · Comprende estas instrucciones.  · Controlará su afección.  · Recibirá ayuda de inmediato si no mejora o si empeora.     Esta información no tiene como fin reemplazar el consejo del médico. Asegúrese de hacerle al médico cualquier pregunta que tenga.     Document Released: 02/09/2012 Document Revised: 03/07/2013  Elsevier Interactive Patient Education ©2016 Elsevier Inc.

## 2016-03-08 ENCOUNTER — Ambulatory Visit
Admission: RE | Admit: 2016-03-08 | Discharge: 2016-03-08 | Disposition: A | Discharge status: Home / Self Care | Payer: No Typology Code available for payment source | Source: Ambulatory Visit | Attending: Gynecology | Admitting: Gynecology

## 2016-03-08 DIAGNOSIS — Z1231 Encounter for screening mammogram for malignant neoplasm of breast: Secondary | ICD-10-CM

## 2016-03-18 ENCOUNTER — Ambulatory Visit (INDEPENDENT_AMBULATORY_CARE_PROVIDER_SITE_OTHER): Payer: Self-pay | Admitting: Gynecology

## 2016-03-18 ENCOUNTER — Encounter: Payer: Self-pay | Admitting: Gynecology

## 2016-03-18 VITALS — BP 118/74

## 2016-03-18 DIAGNOSIS — Z30431 Encounter for routine checking of intrauterine contraceptive device: Secondary | ICD-10-CM

## 2016-03-18 LAB — CBC WITH DIFFERENTIAL/PLATELET
BASOS PCT: 1 %
Basophils Absolute: 42 cells/uL (ref 0–200)
EOS ABS: 84 {cells}/uL (ref 15–500)
Eosinophils Relative: 2 %
HEMATOCRIT: 42.1 % (ref 35.0–45.0)
HEMOGLOBIN: 14.1 g/dL (ref 11.7–15.5)
LYMPHS ABS: 2016 {cells}/uL (ref 850–3900)
Lymphocytes Relative: 48 %
MCH: 30.4 pg (ref 27.0–33.0)
MCHC: 33.5 g/dL (ref 32.0–36.0)
MCV: 90.7 fL (ref 80.0–100.0)
MONO ABS: 252 {cells}/uL (ref 200–950)
MPV: 9.3 fL (ref 7.5–12.5)
Monocytes Relative: 6 %
NEUTROS ABS: 1806 {cells}/uL (ref 1500–7800)
Neutrophils Relative %: 43 %
PLATELETS: 264 10*3/uL (ref 140–400)
RBC: 4.64 MIL/uL (ref 3.80–5.10)
RDW: 13.4 % (ref 11.0–15.0)
WBC: 4.2 10*3/uL (ref 3.8–10.8)

## 2016-03-18 LAB — COMPREHENSIVE METABOLIC PANEL
ALT: 9 U/L (ref 6–29)
AST: 15 U/L (ref 10–35)
Albumin: 4.3 g/dL (ref 3.6–5.1)
Alkaline Phosphatase: 53 U/L (ref 33–115)
BUN: 10 mg/dL (ref 7–25)
CALCIUM: 9.5 mg/dL (ref 8.6–10.2)
CHLORIDE: 104 mmol/L (ref 98–110)
CO2: 26 mmol/L (ref 20–31)
Creat: 0.57 mg/dL (ref 0.50–1.10)
GLUCOSE: 90 mg/dL (ref 65–99)
POTASSIUM: 4.4 mmol/L (ref 3.5–5.3)
Sodium: 139 mmol/L (ref 135–146)
Total Bilirubin: 0.5 mg/dL (ref 0.2–1.2)
Total Protein: 7.1 g/dL (ref 6.1–8.1)

## 2016-03-18 LAB — LIPID PANEL
CHOL/HDL RATIO: 2.6 ratio (ref <=5.0)
Cholesterol: 152 mg/dL (ref 125–200)
HDL: 59 mg/dL (ref >=46)
LDL CALC: 73 mg/dL (ref <130)
Triglycerides: 99 mg/dL (ref <150)
VLDL: 20 mg/dL (ref <30)

## 2016-03-18 LAB — TSH: TSH: 1.54 m[IU]/L

## 2016-03-18 NOTE — Progress Notes (Signed)
   Patient is a 45 year old who presented to the office today for her 1 month follow-up after having placed te [place] IUD. Patient is doing well had some spotting is to be anticipated and occasional cramping but otherwise has done well. She is here also for her fasting blood work that was not done at time of her annual exam on September 7 because she had eaten. The following blood work will be ordered: Comprehensive metabolic panel, fasting lipid profile, TSH, CBC, and urinalysis.  Exam: Abdomen: Soft nontender no rebound or guarding Pelvic: Bartholin urethra Skene was within normal limits Vagina: No lesions or discharge Cervix: IUD string visualized and trimmed Uterus: Anteverted normal size shape and consistency Adnexa: No palpable mass or tenderness Rectal exam not done  Assessment/plan: Patient one month status post placement of IUD doing well. Patient otherwise scheduled to return to the office next year for her annual exam. Fasting blood work drawn today.

## 2016-05-31 HISTORY — PX: CHOLECYSTECTOMY: SHX55

## 2016-10-13 ENCOUNTER — Encounter: Payer: Self-pay | Admitting: Gynecology

## 2017-02-24 ENCOUNTER — Other Ambulatory Visit: Payer: Self-pay | Admitting: Gynecology

## 2017-03-31 ENCOUNTER — Ambulatory Visit (INDEPENDENT_AMBULATORY_CARE_PROVIDER_SITE_OTHER): Payer: Self-pay | Admitting: Obstetrics & Gynecology

## 2017-03-31 ENCOUNTER — Other Ambulatory Visit: Payer: Self-pay | Admitting: Obstetrics & Gynecology

## 2017-03-31 ENCOUNTER — Encounter: Payer: Self-pay | Admitting: Obstetrics & Gynecology

## 2017-03-31 VITALS — BP 126/80 | Ht 62.0 in | Wt 133.0 lb

## 2017-03-31 DIAGNOSIS — Z975 Presence of (intrauterine) contraceptive device: Secondary | ICD-10-CM

## 2017-03-31 DIAGNOSIS — Z1151 Encounter for screening for human papillomavirus (HPV): Secondary | ICD-10-CM

## 2017-03-31 DIAGNOSIS — Z139 Encounter for screening, unspecified: Secondary | ICD-10-CM

## 2017-03-31 DIAGNOSIS — Z01419 Encounter for gynecological examination (general) (routine) without abnormal findings: Secondary | ICD-10-CM

## 2017-03-31 NOTE — Addendum Note (Signed)
Addended by: Thurnell Garbe A on: 03/31/2017 10:55 AM   Modules accepted: Orders

## 2017-03-31 NOTE — Patient Instructions (Signed)
1. Encounter for routine gynecological examination with Papanicolaou smear of cervix Normal gyn exam.  Pap/HPV HR done.  Breasts wnl.  Screening Mammo organized at the [facility] (phone call by Earnest Bailey to schedule).  2. IUD contraception Liletta IUD in good position, well tolerated.  Patient reassured that the strings are just the right length and the IUD is in good position.  Katherine Bird, fue un placer conecerla hoy!  Voy a informarla de Financial trader.  [place] (Health Maintenance, Female) Un estilo de vida saludable y los cuidados preventivos pueden favorecer considerablemente a la salud y Musician. Pregunte a su mdico cul es el cronograma de exmenes peridicos apropiado para usted. Esta es una buena oportunidad para consultarlo sobre cmo prevenir enfermedades y Leedey sano. Adems de los controles, hay muchas otras cosas que puede hacer usted mismo. Los expertos han realizado numerosas investigaciones [employer]itor cambios en el estilo de vida y las medidas de prevencin que, [place], lo ayudarn a mantenerse sano. Solicite a su mdico ms informacin. EL PESO Y LA DIETA Consuma una dieta saludable.  Asegrese de [employer] verduras, frutas, productos lcteos de bajo contenido de [place] y Advertising account planner.  No consuma muchos alimentos de alto contenido de grasas slidas, azcares agregados o sal.  Realice actividad fsica con regularidad. Esta es una de las prcticas ms importantes que puede hacer por su salud. ? La Delorise Shiner de los adultos deben hacer ejercicio durante al menos 18mnutos por semana. El ejercicio debe aumentar la frecuencia cardaca y pActorla transpiracin (ejercicio de iL[place]. ? La mayora de los adultos tambin deben hacer ejercicios de elongacin al mToysRusveces a la semana. Agregue esto al su plan de ejercicio de intensidad moderada. Mantenga un peso saludable.  El ndice de masa corporal  ([facility] es una medida que puede utilizarse para identificar posibles problemas de pPinnacle Proporciona una estimacin de la grasa corporal basndose en el peso y la altura. Su mdico puede ayudarle a dRadiation protection practitionerICedar Glen Lakesy a lScientist, forensico mTheatre managerun peso saludable.  Para las mujeres de 20aos o ms: ? Un ISurgery Center Of Farmington LLCmenor de 18,5 se considera bajo peso. ? Un IIdaho Eye Center Rexburgentre 18,5 y 24,9 es normal. ? Un IAscension Ne Wisconsin Mercy Campusentre 25 y 29,9 se considera sobrepeso. ? Un IMC de 30 o ms se considera obesidad. Observe los niveles de colesterol y lpidos en la sangre.  Debe comenzar a rEnglish as a second language teacherde lpidos y cResearch officer, trade unionen la sangre a los 20aos y luego repetirlos cada 543aos  Es posible que nAutomotive engineerlos niveles de colesterol con mayor frecuencia si: ? Sus niveles de lpidos y colesterol son altos. ? Es mayor de 553PHK ? Presenta un alto riesgo de padecer enfermedades cardacas. DETECCIN DE CNCER Cncer de pulmn  Se recomienda realizar exmenes de deteccin de cncer de pulmn a personas adultas entre 553y 872aos que estn en riesgo de dHorticulturist, commercialde pulmn por sus antecedentes de consumo de tabaco.  Se recomienda una tomografa computarizada de baja dosis de los pulmones todos los aos a las personas que: ? Fuman actualmente. ? Hayan dejado el hbito en algn momento en los ltimos 15aos. ? Hayan fumado durante 30aos un paquete diario. Un paquete-ao equivale a fumar un promedio de un paquete de cigarrillos diario durante un ao.  Los exmenes de deteccin anuales deben continuar hasta que hayan pasado 15aos desde que dej de fumar.  Ya no debern realizarse si tiene un problema de sDollar General  impida recibir tratamiento para Science writer de pulmn. Cncer de mama  Practique la autoconciencia de la mama. Esto significa reconocer la apariencia normal de sus mamas y cmo las siente.  Tambin significa realizar autoexmenes regulares de [employer]. Informe a su mdico sobre cualquier cambio, sin  importar cun pequeo sea.  Si tiene entre 20 y 88 aos, un mdico debe realizarle un examen clnico de las mamas como parte del examen regular de [place], cada 1 a 3aos.  Si tiene 40aos o ms, debe Information systems manager clnico de las Microsoft. Tambin considere realizarse una [place]) todos los [place].  Si tiene antecedentes familiares de cncer de mama, hable con su mdico para someterse a un estudio gentico.  Si tiene alto riesgo de Chief Financial Officer de mama, hable con su mdico para someterse a Public house manager y [employer].  La evaluacin del gen del cncer de mama (BRCA) se recomienda a mujeres que tengan familiares con cnceres relacionados con el BRCA. Los cnceres relacionados con el BRCA incluyen los siguientes: ? [place]. ? Ovario. ? Trompas. ? Cnceres de peritoneo.  Los resultados de la evaluacin determinarn la necesidad de asesoramiento gentico y de [place] de BRCA1 y BRCA2. Cncer de cuello del tero El mdico puede recomendarle que se haga pruebas peridicas de deteccin de cncer de los rganos de la pelvis (ovarios, tero y vagina). Estas pruebas incluyen un examen plvico, que abarca controlar si se produjeron cambios microscpicos en la superficie del cuello del tero (prueba de Papanicolaou). Pueden recomendarle que se haga estas pruebas cada 3aos, a partir de los 21aos.  A las mujeres que tienen entre 30 y 69aos, los mdicos pueden recomendarles que se sometan a exmenes plvicos y pruebas de Papanicolaou cada 27aos, o a la prueba de Papanicolaou y el examen plvico en combinacin con estudios de deteccin del virus del papiloma humano (VPH) cada 5aos. Algunos tipos de VPH aumentan el riesgo de Chief Financial Officer de cuello del tero. La prueba para la deteccin del VPH tambin puede realizarse a mujeres de cualquier edad cuyos resultados de la prueba de Papanicolaou no sean claros.  Es posible que  otros mdicos no recomienden exmenes de deteccin a mujeres no embarazadas que se consideran sujetos de bajo riesgo de Chief Financial Officer de pelvis y que no tienen sntomas. Pregntele al mdico si un examen plvico de deteccin es adecuado para usted.  Si ha recibido un tratamiento para Science writer cervical o una enfermedad que podra causar cncer, necesitar realizarse una prueba de Papanicolaou y controles durante al menos 45 aos de concluido el [place]. Si no se ha hecho el Papanicolaou con regularidad, debern volver a evaluarse los factores de riesgo (como tener un nuevo compaero sexual), para Teacher, adult education si debe realizarse los estudios nuevamente. Algunas mujeres sufren problemas mdicos que aumentan la probabilidad de Museum/gallery curator cncer de cuello del tero. En estos casos, el mdico podr QUALCOMM se realicen controles y pruebas de Papanicolaou con ms frecuencia. Cncer colorrectal  Este tipo de cncer puede detectarse y a menudo prevenirse.  Por lo general, los estudios de rutina se deben Medical laboratory scientific officer a Field seismologist a Proofreader de los 106 aos y [place] 59 aos.  Sin embargo, el mdico podr aconsejarle que lo haga antes, si tiene factores de riesgo para el cncer de colon.  Tambin puede recomendarle que use un kit de prueba para hallar sangre oculta en la materia fecal.  Es posible que se use Ardelia Mems  pequea cmara en el extremo de un tubo para examinar directamente el colon (sigmoidoscopia o colonoscopia) a fin de Hydrographic surveyor formas tempranas de cncer colorrectal.  Los exmenes de rutina generalmente comienzan a los 50aos.  El examen directo del colon se debe repetir cada 5 a 10aos hasta los 75aos. Sin embargo, es posible que se realicen exmenes con mayor frecuencia, si se detectan formas tempranas de plipos precancerosos o pequeos bultos. Cncer de piel  Revise la piel de la cabeza a los pies con regularidad.  Informe a su mdico si aparecen nuevos lunares o los que tiene se modifican,  especialmente en su forma y color.  Tambin notifique al mdico si tiene un lunar que es ms grande que el tamao de una goma de lpiz.  Siempre use pantalla solar. Aplique pantalla solar de Kerry Dory y repetida a lo largo del Training and development officer.  Protjase usando mangas y [employer], un sombrero de ala ancha y gafas para el sol, siempre que se encuentre en el exterior. ENFERMEDADES CARDACAS, DIABETES E HIPERTENSIN ARTERIAL  La hipertensin arterial causa enfermedades cardacas y [place] el riesgo de ictus. La hipertensin arterial es ms probable en los siguientes casos: ? Las personas que tienen la presin arterial en el extremo del rango normal (100-139/85-89 mm Hg). ? An[employer] Corporation con sobrepeso u obesidad. ? Scientist, water quality.  Si usted tiene entre 18 y 39 aos, debe medirse la presin arterial cada 3 a 5 aos. Si usted tiene 40 aos o ms, debe medirse la presin arterial Hewlett[employer]ard. Debe medirse la presin arterial dos veces: una vez cuando est en un hospital o una clnica y la otra vez cuando est en otro sitio. Registre el promedio de [employer] Department Stores. Para controlar su presin arterial cuando no est en un hospital o [place], puede usar lo siguiente: ? Jorje Guild automtica para medir la presin arterial en una farmacia. ? Un monitor para medir la presin arterial en el hogar.  Si tiene entre 68 y 81 aos, consulte a su mdico si debe tomar aspirina para prevenir el ictus.  Realcese exmenes de deteccin de la diabetes con regularidad. Esto incluye la toma de [place] de sangre para controlar el nivel de azcar en la sangre durante el [place]. ? Si tiene un peso normal y un bajo riesgo de padecer diabetes, realcese este anlisis cada tres aos despus de los 45aos. ? Si tiene sobrepeso y un alto riesgo de padecer diabetes, considere someterse a este anlisis antes o con mayor frecuencia. PREVENCIN DE INFECCIONES HepatitisB  Si tiene un riesgo ms alto de  Museum/gallery curator hepatitis B, debe someterse a un examen de deteccin de este virus. Se considera que tiene un alto riesgo de contraer hepatitis B si: ? Naci en un pas donde la hepatitis B es frecuente. Pregntele a su mdico qu pases son considerados de Public affairs consultant. ? Sus padres nacieron en un pas de alto riesgo y usted no recibi una vacuna que lo proteja contra la hepatitis B (vacuna contra la hepatitis B). ? [place]. ? [place] agujas para inyectarse drogas. ? Vive con alguien que tiene hepatitis B. ? Ha tenido sexo con alguien que tiene hepatitis B. ? Recibe tratamiento de hemodilisis. ? Toma ciertos medicamentos para el cncer, trasplante de rganos y afecciones autoinmunitarias. Hepatitis C  Se recomienda un anlisis de [place] para: ? [employer] 1945 y 1965. ? Todas las personas que tengan un riesgo de haber contrado hepatitis C.  Enfermedades de transmisin sexual (ETS).  Debe realizarse pruebas de deteccin de enfermedades de transmisin sexual (ETS), incluidas gonorrea y clamidia si: ? Es sexualmente activo y es menor de 53XYD. ? Es mayor de 24aos, y Investment banker, operational informa que corre riesgo de tener este tipo de infecciones. ? La actividad sexual ha cambiado desde que le hicieron la ltima prueba de deteccin y tiene un riesgo mayor de Best boy clamidia o Radio broadcast assistant. Pregntele al mdico si usted tiene riesgo.  Si no tiene el VIH, pero corre riesgo de infectarse por el virus, se recomienda tomar diariamente un medicamento recetado para evitar la infeccin. Esto se conoce como profilaxis previa a la exposicin. Se considera que est en riesgo si: ? Es [place] sexualmente y no [place] preservativos habitualmente o no conoce el estado del VIH de sus Advertising copywriter. ? Se inyecta drogas. ? Es [place] sexualmente con Ardelia Mems pareja que tiene VIH. Consulte a su mdico para saber si tiene un alto riesgo de infectarse por el VIH. Si opta por comenzar la profilaxis previa a la  exposicin, primero debe realizarse anlisis de deteccin del VIH. Luego, le harn anlisis cada 74mses mientras est tomando los medicamentos para la profilaxis previa a la exposicin. [facility] Si es premenopusica y puede quedar eLilydale solicite a su mdico asesoramiento previo a la concepcin.  Si puede quedar embarazada, tome 400 a [id number]([id number]) de cido fAnheuser-Busch  Si desea evitar el embarazo, hable con su mdico sobre el control de la natalidad (anticoncepcin). OSTEOPOROSIS Y MENOPAUSIA  La osteoporosis es una enfermedad en la que los huesos pierden los minerales y la fuerza por el avance de la edad. El resultado pueden ser fracturas graves en los hTwin Lakes El riesgo de osteoporosis puede identificarse con uArdelia Memsprueba de densidad sea.  Si tiene 65aos o ms, o si est en riesgo de sufrir osteoporosis y fracturas, pregunte a su mdico si debe someterse a exmenes.  Consulte a su mdico si debe tomar un suplemento de calcio o de vitamina D para reducir el riesgo de osteoporosis.  La menopausia puede presentar ciertos sntomas fsicos y rGaffer  La terapia de reemplazo hormonal puede reducir algunos de estos sntomas y rGaffer Consulte a su mdico para saber si la terapia de reemplazo hormonal es conveniente para usted. INSTRUCCIONES PARA EL CUIDADO EN EL HOGAR  Realcese los estudios de rutina de la salud, dentales y de lPublic librarian  MPoynette  No consuma ningn producto que contenga tabaco, lo que incluye cigarrillos, tabaco de mHigher education careers advisero cPsychologist, sport and exercise  Si est embarazada, no beba alcohol.  Si est amamantando, reduzca el consumo de alcohol y la frecuencia con la que consume.  Si es mujer y no est embarazada limite el consumo de alcohol a no ms de 1 medida por da. Una medida equivale a 12onzas de cerveza, 5onzas de vino o 1onzas de bebidas alcohlicas de alta graduacin.  No consuma drogas.  No comparta  agujas.  Solicite ayuda a su mdico si necesita apoyo o informacin para abandonar las drogas.  Informe a su mdico si a menudo se siente deprimido.  Notifique a su mdico si alguna vez ha sido vctima de abuso o si no se siente seguro en su hogar. Esta informacin no tiene cMarine scientistel consejo del mdico. Asegrese de hacerle al mdico cualquier pregunta que tenga. Document Released: 05/06/2011 Document Revised: 06/07/2014 Document Reviewed: 02/18/2015 Elsevier Interactive Patient Education  2[employer]in

## 2017-03-31 NOTE — Progress Notes (Signed)
ALEESE KAMPS Jul 08, 1970 [id number]   History:    46 y.o.  G3P2A1 Married.  Daughter 59 yo, son 65 yo.  Both doing well.  Daughter plays piano and violin.  RP:  Established patient presenting for annual gyn exam  HPI:  Well on Liletta x 01/2016.  No pelvic pain.  Normal vaginal secretions.  Husband feels the strings, but no pain with IC, he wants to make sure the IUD is still in good position.  Breasts wnl.  Due for screening mammo, appointment scheduled with the help of [place] today.  Mictions/BMs wnl.  BMI 24.33  Past medical history,surgical history, family history and social history were all reviewed and documented in the EPIC chart.  Gynecologic History No LMP recorded. Patient is not currently having periods (Reason: IUD). Contraception: IUD Liletta x 01/2016 Last Pap: 01/2016. Results were: Pap reflex Negative Last mammogram: 02/2016. Results were: Negative  Obstetric History OB History  Gravida Para Term Preterm AB Living  3 2     1 2   SAB TAB Ectopic Multiple Live Births               # Outcome Date GA Lbr Len/2nd Weight Sex Delivery Anes PTL Lv  3 AB           2 Para           1 Para                ROS: A ROS was performed and pertinent positives and negatives are included in the history.  GENERAL: No fevers or chills. HEENT: No change in vision, no earache, sore throat or sinus congestion. NECK: No pain or stiffness. CARDIOVASCULAR: No chest pain or pressure. No palpitations. PULMONARY: No shortness of breath, cough or wheeze. GASTROINTESTINAL: No abdominal pain, nausea, vomiting or diarrhea, melena or bright red blood per rectum. GENITOURINARY: No urinary frequency, urgency, hesitancy or dysuria. MUSCULOSKELETAL: No joint or muscle pain, no back pain, no recent trauma. DERMATOLOGIC: No rash, no itching, no lesions. ENDOCRINE: No polyuria, polydipsia, no heat or cold intolerance. No recent change in weight. HEMATOLOGICAL: No anemia or easy bruising or bleeding.  NEUROLOGIC: No headache, seizures, numbness, tingling or weakness. PSYCHIATRIC: No depression, no loss of interest in normal activity or change in sleep pattern.     Exam:   BP 126/80   Ht 5\' 2"  (1.575 m)   Wt 133 lb (60.3 kg)   BMI 24.33 kg/m   Body mass index is 24.33 kg/m.  General appearance : Well developed well nourished female. No acute distress HEENT: Eyes: no retinal hemorrhage or exudates,  Neck supple, trachea midline, no carotid bruits, no thyroidmegaly Lungs: Clear to auscultation, no rhonchi or wheezes, or rib retractions  Heart: Regular rate and rhythm, no murmurs or gallops Breast:Examined in sitting and supine position were symmetrical in appearance, no palpable masses or tenderness,  no skin retraction, no nipple inversion, no nipple discharge, no skin discoloration, no axillary or supraclavicular lymphadenopathy Abdomen: no palpable masses or tenderness, no rebound or guarding Extremities: no edema or skin discoloration or tenderness  Pelvic: Vulva normal  Bartholin, Urethra, Skene Glands: Within normal limits             Vagina: No gross lesions or discharge  Cervix: No gross lesions or discharge.  Strings visible, just the right length about 1.5 cm, no IUD part seen.  Pap/HPV HR done.  Uterus  AV, normal size, shape and consistency, non-tender and mobile  Adnexa  Without masses or tenderness  Anus and perineum  normal    Assessment/Plan:  46 y.o. female for annual exam   1. Encounter for routine gynecological examination with Papanicolaou smear of cervix Normal gyn exam.  Pap/HPV HR done.  Breasts wnl.  Screening Mammo organized at the [facility] (phone call by Earnest Bailey to schedule).  2. IUD contraception Liletta IUD in good position, well tolerated.  Patient reassured that the strings are just the right length and the IUD is in good position.  Princess Bruins MD, 10:13 AM 03/31/2017

## 2017-04-04 LAB — PAP, TP IMAGING W/ HPV RNA, RFLX HPV TYPE 16,18/45: HPV DNA High Risk: NOT DETECTED

## 2017-04-05 ENCOUNTER — Other Ambulatory Visit: Payer: Self-pay | Admitting: Anesthesiology

## 2017-04-05 ENCOUNTER — Other Ambulatory Visit: Payer: Self-pay

## 2017-04-05 DIAGNOSIS — Z01419 Encounter for gynecological examination (general) (routine) without abnormal findings: Secondary | ICD-10-CM

## 2017-04-07 ENCOUNTER — Other Ambulatory Visit: Payer: Self-pay

## 2017-04-07 DIAGNOSIS — Z01419 Encounter for gynecological examination (general) (routine) without abnormal findings: Secondary | ICD-10-CM

## 2017-04-11 LAB — COMPREHENSIVE METABOLIC PANEL
AG Ratio: 1.7 (calc) (ref 1.0–2.5)
ALBUMIN MSPROF: 4.4 g/dL (ref 3.6–5.1)
ALKALINE PHOSPHATASE (APISO): 56 U/L (ref 33–115)
ALT: 8 U/L (ref 6–29)
AST: 16 U/L (ref 10–35)
BUN: 12 mg/dL (ref 7–25)
CO2: 28 mmol/L (ref 20–32)
CREATININE: 0.59 mg/dL (ref 0.50–1.10)
Calcium: 9.4 mg/dL (ref 8.6–10.2)
Chloride: 102 mmol/L (ref 98–110)
Globulin: 2.6 g/dL (calc) (ref 1.9–3.7)
Glucose, Bld: 87 mg/dL (ref 65–99)
Potassium: 4.2 mmol/L (ref 3.5–5.3)
Sodium: 137 mmol/L (ref 135–146)
Total Bilirubin: 0.6 mg/dL (ref 0.2–1.2)
Total Protein: 7 g/dL (ref 6.1–8.1)

## 2017-04-11 LAB — CBC WITH DIFFERENTIAL/PLATELET
BASOS PCT: 0.9 %
Basophils Absolute: 39 cells/uL (ref 0–200)
EOS ABS: 280 {cells}/uL (ref 15–500)
Eosinophils Relative: 6.5 %
HEMATOCRIT: 40.9 % (ref 35.0–45.0)
Hemoglobin: 14.3 g/dL (ref 11.7–15.5)
Lymphs Abs: 1914 cells/uL (ref 850–3900)
MCH: 31.9 pg (ref 27.0–33.0)
MCHC: 35 g/dL (ref 32.0–36.0)
MCV: 91.3 fL (ref 80.0–100.0)
MPV: 10.6 fL (ref 7.5–12.5)
Monocytes Relative: 6.3 %
NEUTROS PCT: 41.8 %
Neutro Abs: 1797 cells/uL (ref 1500–7800)
PLATELETS: 279 10*3/uL (ref 140–400)
RBC: 4.48 10*6/uL (ref 3.80–5.10)
RDW: 12.1 % (ref 11.0–15.0)
TOTAL LYMPHOCYTE: 44.5 %
WBC: 4.3 10*3/uL (ref 3.8–10.8)
WBCMIX: 271 {cells}/uL (ref 200–950)

## 2017-04-11 LAB — VITAMIN D 1,25 DIHYDROXY
VITAMIN D 1, 25 (OH) TOTAL: 31 pg/mL (ref 18–72)
VITAMIN D3 1, 25 (OH): 31 pg/mL

## 2017-04-11 LAB — LIPID PANEL
CHOL/HDL RATIO: 2.7 (calc) (ref <5.0)
CHOLESTEROL: 159 mg/dL (ref <200)
HDL: 58 mg/dL (ref >50)
LDL CHOLESTEROL (CALC): 86 mg/dL
Non-HDL Cholesterol (Calc): 101 mg/dL (calc) (ref <130)
Triglycerides: 61 mg/dL (ref <150)

## 2017-04-11 LAB — TSH: TSH: 1.31 mIU/L

## 2017-05-02 ENCOUNTER — Ambulatory Visit
Admission: RE | Admit: 2017-05-02 | Discharge: 2017-05-02 | Disposition: A | Discharge status: Home / Self Care | Payer: No Typology Code available for payment source | Source: Ambulatory Visit | Attending: Obstetrics & Gynecology | Admitting: Obstetrics & Gynecology

## 2017-05-02 DIAGNOSIS — Z139 Encounter for screening, unspecified: Secondary | ICD-10-CM

## 2018-04-06 ENCOUNTER — Encounter: Payer: Self-pay | Admitting: Obstetrics & Gynecology

## 2018-04-06 ENCOUNTER — Ambulatory Visit (INDEPENDENT_AMBULATORY_CARE_PROVIDER_SITE_OTHER): Payer: Self-pay | Admitting: Obstetrics & Gynecology

## 2018-04-06 VITALS — BP 130/78 | Ht 61.5 in | Wt 135.0 lb

## 2018-04-06 DIAGNOSIS — Z30431 Encounter for routine checking of intrauterine contraceptive device: Secondary | ICD-10-CM

## 2018-04-06 DIAGNOSIS — Z01419 Encounter for gynecological examination (general) (routine) without abnormal findings: Secondary | ICD-10-CM

## 2018-04-06 DIAGNOSIS — N951 Menopausal and female climacteric states: Secondary | ICD-10-CM

## 2018-04-06 NOTE — Progress Notes (Signed)
Katherine Bird [birth year] [id number]   History:    47 y.o. G3P2A1L2 Married  RP:  Established patient presenting for annual gyn exam   HPI: Well on Liletta x 01/2016.  No menstrual periods or BTB.  No pelvic pain.  C/O hot flushes, night sweats and low libido as well as less energy for a few weeks.  No pain with IC.  Breasts wnl.  Urine/BMs wnl.  Health labs with Fam MD.  Past medical history,surgical history, family history and social history were all reviewed and documented in the EPIC chart.  Gynecologic History No LMP recorded. (Menstrual status: IUD). Contraception: Liletta IUD x 01/2016 Last Pap: 03/2017. Results were: Negative/HPV HR neg, but TZ absent Last mammogram: 04/2017. Results were: Negative Bone Density: Never Colonoscopy: Never  Obstetric History OB History  Gravida Para Term Preterm AB Living  3 2     1 2   SAB TAB Ectopic Multiple Live Births               # Outcome Date GA Lbr Len/2nd Weight Sex Delivery Anes PTL Lv  3 AB           2 Para           1 Para              ROS: A ROS was performed and pertinent positives and negatives are included in the history.  GENERAL: No fevers or chills. HEENT: No change in vision, no earache, sore throat or sinus congestion. NECK: No pain or stiffness. CARDIOVASCULAR: No chest pain or pressure. No palpitations. PULMONARY: No shortness of breath, cough or wheeze. GASTROINTESTINAL: No abdominal pain, nausea, vomiting or diarrhea, melena or bright red blood per rectum. GENITOURINARY: No urinary frequency, urgency, hesitancy or dysuria. MUSCULOSKELETAL: No joint or muscle pain, no back pain, no recent trauma. DERMATOLOGIC: No rash, no itching, no lesions. ENDOCRINE: No polyuria, polydipsia, no heat or cold intolerance. No recent change in weight. HEMATOLOGICAL: No anemia or easy bruising or bleeding. NEUROLOGIC: No headache, seizures, numbness, tingling or weakness. PSYCHIATRIC: No depression, no loss of interest in normal  activity or change in sleep pattern.     Exam:   BP 130/78   Ht 5' 1.5" (1.562 m)   Wt 135 lb (61.2 kg)   BMI 25.09 kg/m   Body mass index is 25.09 kg/m.  General appearance : Well developed well nourished female. No acute distress HEENT: Eyes: no retinal hemorrhage or exudates,  Neck supple, trachea midline, no carotid bruits, no thyroidmegaly Lungs: Clear to auscultation, no rhonchi or wheezes, or rib retractions  Heart: Regular rate and rhythm, no murmurs or gallops Breast:Examined in sitting and supine position were symmetrical in appearance, no palpable masses or tenderness,  no skin retraction, no nipple inversion, no nipple discharge, no skin discoloration, no axillary or supraclavicular lymphadenopathy Abdomen: no palpable masses or tenderness, no rebound or guarding Extremities: no edema or skin discoloration or tenderness  Pelvic: Vulva: Normal             Vagina: No gross lesions or discharge  Cervix: No gross lesions or discharge.  IUD strings short but visible.  Pap reflex done  Uterus  AV, normal size, shape and consistency, non-tender and mobile  Adnexa  Without masses or tenderness  Anus: Normal   Assessment/Plan:  47 y.o. female for annual exam   1. Encounter for routine gynecological examination with Papanicolaou smear of cervix Normal gynecologic exam.  Pap reflex done.  Breast exam normal.  Will schedule screening mammogram in December 2019.  Health labs with family physician.  Body mass index good at 25.09.  2. Encounter for routine checking of intrauterine contraceptive device (IUD) Well on [place] IUD since September 2017 and in good position.  3. Perimenopause Vasomotor menopausal symptoms as well as decreased libido and low energy.  Will verify menopausal status with an [place] and rule out thyroid dysfunction with a TSH. - FSH - TSH  Princess Bruins MD, 12:26 PM 04/06/2018

## 2018-04-06 NOTE — Patient Instructions (Signed)
1. Encounter for routine gynecological examination with Papanicolaou smear of cervix Normal gynecologic exam.  Pap reflex done.  Breast exam normal.  Will schedule screening mammogram in December 2019.  Health labs with family physician.  Body mass index good at 25.09.  2. Encounter for routine checking of intrauterine contraceptive device (IUD) Well on [place] IUD since September 2017 and in good position.  3. Perimenopause Vasomotor menopausal symptoms as well as decreased libido and low energy.  Will verify menopausal status with an [facility] and rule out thyroid dysfunction with a TSH. - FSH - TSH  Benjamine Mola, fue un placer verle hoy!  Voy a informarle de sus [employer].

## 2018-04-07 LAB — PAP IG W/ RFLX HPV ASCU

## 2018-04-07 LAB — TSH: TSH: 1.37 mIU/L

## 2018-04-07 LAB — FOLLICLE STIMULATING HORMONE: FSH: 75.9 m[IU]/mL

## 2018-05-09 ENCOUNTER — Other Ambulatory Visit: Payer: Self-pay | Admitting: Obstetrics & Gynecology

## 2018-05-09 DIAGNOSIS — Z1231 Encounter for screening mammogram for malignant neoplasm of breast: Secondary | ICD-10-CM

## 2018-06-16 ENCOUNTER — Ambulatory Visit
Admission: RE | Admit: 2018-06-16 | Discharge: 2018-06-16 | Disposition: A | Discharge status: Home / Self Care | Payer: No Typology Code available for payment source | Source: Ambulatory Visit | Attending: Obstetrics & Gynecology | Admitting: Obstetrics & Gynecology

## 2018-06-16 DIAGNOSIS — Z1231 Encounter for screening mammogram for malignant neoplasm of breast: Secondary | ICD-10-CM

## 2019-01-15 ENCOUNTER — Other Ambulatory Visit: Payer: Self-pay

## 2019-01-16 ENCOUNTER — Ambulatory Visit (INDEPENDENT_AMBULATORY_CARE_PROVIDER_SITE_OTHER): Payer: Self-pay | Admitting: Obstetrics & Gynecology

## 2019-01-16 ENCOUNTER — Encounter: Payer: Self-pay | Admitting: Obstetrics & Gynecology

## 2019-01-16 VITALS — BP 126/84

## 2019-01-16 DIAGNOSIS — T8332XA Displacement of intrauterine contraceptive device, initial encounter: Secondary | ICD-10-CM

## 2019-01-16 DIAGNOSIS — Z538 Procedure and treatment not carried out for other reasons: Secondary | ICD-10-CM

## 2019-01-16 DIAGNOSIS — Z975 Presence of (intrauterine) contraceptive device: Secondary | ICD-10-CM

## 2019-01-16 NOTE — Patient Instructions (Signed)
1. Attempted IUD removal, unsuccessful Liletta IUD since September 2017.  Having mild cramps currently.  Documented menopause in November 2019 with an [facility] at 75.9.  Mildly symptomatic with hot flushes and no vaginal bleeding.  Decision to remove the [place] IUD in those circumstances.  IUD strings not visible on exam today.  Attempted removal of the IUD was unsuccessful.  Well-tolerated and no complication.  Patient will follow-up shortly for a pelvic ultrasound to locate the IUD and IUD removal under ultrasound guidance if the IUD is confirmed to be intra-uterine.  In formation and plan discussed with patient who voiced understanding and agreement.  Will need to use condoms x1 year after removal of the IUD. - US Transvaginal Non-OB; Future  2. Intrauterine contraceptive device threads lost, initial encounter As above. - US Transvaginal Non-OB; Future  Katherine Bird, fue un placer verle hoy!

## 2019-01-16 NOTE — Progress Notes (Signed)
    [name] January 21, 1971 [id number]        48 y.o.  [id number]  Married  RP: Katherine Bird IUD removal  HPI: Katherine Bird x 01/2016.  No menses or BTB.  [place] high at 75.9 in 03/2018.  Mild hot flushes.  Mild cramping, desires removal of IUD.  No abnormal vaginal discharge.  No fever.   OB History  Gravida Para Term Preterm AB Living  3 2     1 2   SAB TAB Ectopic Multiple Live Births               # Outcome Date GA Lbr Len/2nd Weight Sex Delivery Anes PTL Lv  3 AB           2 Para           1 Para             Past medical history,surgical history, problem list, medications, allergies, family history and social history were all reviewed and documented in the EPIC chart.   Directed ROS with pertinent positives and negatives documented in the history of present illness/assessment and plan.  Exam:  Vitals:   01/16/19 0837  BP: 126/84   General appearance:  Normal  Abdomen: Normal  Gynecologic exam: Vulva normal.  Speculum:  Cervix/Vagina normal. Normal vaginal secretions.  IUD strings not visible.  Betadine prep done.  Small IUD removal clamp used to attempt grasping the IUD strings in the Endocervical canal.  Unsuccessful.  Os finder used to dilate the cervix and gain information on the cervical canal curve.  Attempted to grasp the IUD strings in the Endocervical canal and at the LUS, without success.  Decision to stop the attempts and have the patient return for IUD removal under US guidance.  All instruments removed.  Uterine cramps present, but otherwise well tolerated by patient.  No complication.   Assessment/Plan:  48 y.o. [id number]   1. Attempted IUD removal, unsuccessful Katherine Bird IUD since September 2017.  Having mild cramps currently.  Documented menopause in November 2019 with an [facility] at 75.9.  Mildly symptomatic with hot flushes and no vaginal bleeding.  Decision to remove the [place] IUD in those circumstances.  IUD strings not visible on exam today.  Attempted removal of  the IUD was unsuccessful.  Well-tolerated and no complication.  Patient will follow-up shortly for a pelvic ultrasound to locate the IUD and IUD removal under ultrasound guidance if the IUD is confirmed to be intra-uterine.  In formation and plan discussed with patient who voiced understanding and agreement.  Will need to use condoms x1 year after removal of the IUD. - US Transvaginal Non-OB; Future  2. Intrauterine contraceptive device threads lost, initial encounter As above. - US Transvaginal Non-OB; Future  Counseling on above issues and coordination of care more than 50% for 15 minutes.  Princess Bruins MD, 9:02 AM 01/16/2019

## 2019-02-16 ENCOUNTER — Telehealth: Payer: Self-pay | Admitting: *Deleted

## 2019-02-16 ENCOUNTER — Other Ambulatory Visit: Payer: Self-pay

## 2019-02-16 NOTE — Telephone Encounter (Addendum)
Pt called to ask for a pain reliever before IUD removal on Monday I told pt I will send message to DR.Lavoie to make the recommendation as far as what she will use to relive pain during removal of IUD.  I recommended Aleve, or Advil,or ibuprofen 30 min before she comes will route this to [facility] for awareness

## 2019-02-16 NOTE — Telephone Encounter (Signed)
Agree with NSAIDS as you recommended 1 hour or more before the IUD removal.  I can also spray Hurricane on the cervix.

## 2019-02-19 ENCOUNTER — Ambulatory Visit (INDEPENDENT_AMBULATORY_CARE_PROVIDER_SITE_OTHER): Payer: Self-pay

## 2019-02-19 ENCOUNTER — Encounter: Payer: Self-pay | Admitting: Obstetrics & Gynecology

## 2019-02-19 ENCOUNTER — Other Ambulatory Visit: Payer: Self-pay

## 2019-02-19 ENCOUNTER — Ambulatory Visit (INDEPENDENT_AMBULATORY_CARE_PROVIDER_SITE_OTHER): Payer: Self-pay | Admitting: Obstetrics & Gynecology

## 2019-02-19 ENCOUNTER — Other Ambulatory Visit: Payer: Self-pay | Admitting: Obstetrics & Gynecology

## 2019-02-19 DIAGNOSIS — T8332XD Displacement of intrauterine contraceptive device, subsequent encounter: Secondary | ICD-10-CM

## 2019-02-19 DIAGNOSIS — Z30432 Encounter for removal of intrauterine contraceptive device: Secondary | ICD-10-CM

## 2019-02-19 DIAGNOSIS — N854 Malposition of uterus: Secondary | ICD-10-CM

## 2019-02-19 DIAGNOSIS — Z538 Procedure and treatment not carried out for other reasons: Secondary | ICD-10-CM

## 2019-02-19 DIAGNOSIS — T8332XA Displacement of intrauterine contraceptive device, initial encounter: Secondary | ICD-10-CM

## 2019-02-19 DIAGNOSIS — Z975 Presence of (intrauterine) contraceptive device: Secondary | ICD-10-CM

## 2019-02-19 DIAGNOSIS — Z780 Asymptomatic menopausal state: Secondary | ICD-10-CM

## 2019-02-19 NOTE — Patient Instructions (Signed)
1. Intrauterine contraceptive device threads lost, subsequent encounter IUD strings lost with unsuccessful attempt at removal last visit.  Pelvic ultrasound today showing the Mirena IUD in normal intrauterine position slightly to the left.  Normal pelvic ultrasound, findings reviewed with patient.  IUD strings found in the endocervical canal, IUD removed easily, intact and complete.  No complication.  Well-tolerated by patient.  2. Encounter for IUD removal IUD removed without difficulty or complication.  Intact and complete.  Well-tolerated by patient.  3. Postmenopause Postmenopausal for more than a year.  No postmenopausal bleeding.  Katherine Bird, it was a pleasure seeing you today!

## 2019-02-19 NOTE — Progress Notes (Signed)
    Katherine Bird 1970-06-07 [id number]        48 y.o.  [id number]  Married  RP: Mirena IUD removal/IUD strings lost, under US guidance  HPI: Menopause confirmed with [place] at 75.9 03/2018.  No PMB.  No pelvic pain.     OB History  Gravida Para Term Preterm AB Living  3 2     1 2   SAB TAB Ectopic Multiple Live Births               # Outcome Date GA Lbr Len/2nd Weight Sex Delivery Anes PTL Lv  3 AB           2 Para           1 Para             Past medical history,surgical history, problem list, medications, allergies, family history and social history were all reviewed and documented in the EPIC chart.   Directed ROS with pertinent positives and negatives documented in the history of present illness/assessment and plan.  Exam:  There were no vitals filed for this visit. General appearance:  Normal  Pelvic US today: T/V and T/a images.  Anteverted uterus measuring 8.39 x 5.55 x 4.11 cm, deviates to the left, normal size and shape.  No myometrial mass.  IUD seen in normal intrauterine position.  Endometrial lining thin and symmetrical measured at 3.83 mm.  No endometrial mass or thickening.  Both ovaries normal in size.  No adnexal mass.  No free fluid in the posterior cul-de-sac.  IUD removal:  Verbal consent obtained.  Vulva normal.  Speculum: Cervix/Vagina normal.  Normal vaginal secretions.  Betadine prep of the cervix.  Hurricane spray.  IUD strings visible in the Endocervical canal.  IUD strings grasped with the IUD removal clamp.  IUD easily removed.  Intact and complete.  Shown to patient and discarded.  Speculum removed.  No Cx.  Well tolerated.    Assessment/Plan:  48 y.o. [id number]   1. Intrauterine contraceptive device threads lost, subsequent encounter IUD strings lost with unsuccessful attempt at removal last visit.  Pelvic ultrasound today showing the Mirena IUD in normal intrauterine position slightly to the left.  Normal pelvic ultrasound, findings reviewed with  patient.  IUD strings found in the endocervical canal, IUD removed easily, intact and complete.  No complication.  Well-tolerated by patient.  2. Encounter for IUD removal IUD removed without difficulty or complication.  Intact and complete.  Well-tolerated by patient.  3. Postmenopause Postmenopausal for more than a year.  No postmenopausal bleeding.  Princess Bruins MD, 9:50 AM 02/19/2019

## 2019-04-10 ENCOUNTER — Encounter: Payer: Self-pay | Admitting: Obstetrics & Gynecology

## 2019-05-16 ENCOUNTER — Other Ambulatory Visit: Payer: Self-pay

## 2019-05-17 ENCOUNTER — Encounter: Payer: Self-pay | Admitting: Obstetrics & Gynecology

## 2019-05-17 ENCOUNTER — Ambulatory Visit (INDEPENDENT_AMBULATORY_CARE_PROVIDER_SITE_OTHER): Payer: Self-pay | Admitting: Obstetrics & Gynecology

## 2019-05-17 VITALS — BP 126/78 | Ht 61.5 in | Wt 132.0 lb

## 2019-05-17 DIAGNOSIS — Z780 Asymptomatic menopausal state: Secondary | ICD-10-CM

## 2019-05-17 DIAGNOSIS — Z01419 Encounter for gynecological examination (general) (routine) without abnormal findings: Secondary | ICD-10-CM

## 2019-05-17 NOTE — Progress Notes (Signed)
Katherine Bird 08/19/1970 [id number]   History:    48 y.o. G3P2A1L2 Married  RP:  Established patient presenting for annual gyn exam   HPI:  Postmenopausal x >1 year, well on no HRT.  No PMB.  No pelvic pain.  Not sexually active currently as husband is worried about the risk of conceiving and doesn't want to use condoms.  Matrimonial issues.  Breasts wnl. Urine/BMs wnl. BMI 24.54.  Good fitness. Health labs with Fam MD.  Past medical history,surgical history, family history and social history were all reviewed and documented in the EPIC chart.  Gynecologic History Patient's last menstrual period was 02/16/2016.  Obstetric History OB History  Gravida Para Term Preterm AB Living  3 2     1 2   SAB TAB Ectopic Multiple Live Births               # Outcome Date GA Lbr Len/2nd Weight Sex Delivery Anes PTL Lv  3 AB           2 Para           1 Para              ROS: A ROS was performed and pertinent positives and negatives are included in the history.  GENERAL: No fevers or chills. HEENT: No change in vision, no earache, sore throat or sinus congestion. NECK: No pain or stiffness. CARDIOVASCULAR: No chest pain or pressure. No palpitations. PULMONARY: No shortness of breath, cough or wheeze. GASTROINTESTINAL: No abdominal pain, nausea, vomiting or diarrhea, melena or bright red blood per rectum. GENITOURINARY: No urinary frequency, urgency, hesitancy or dysuria. MUSCULOSKELETAL: No joint or muscle pain, no back pain, no recent trauma. DERMATOLOGIC: No rash, no itching, no lesions. ENDOCRINE: No polyuria, polydipsia, no heat or cold intolerance. No recent change in weight. HEMATOLOGICAL: No anemia or easy bruising or bleeding. NEUROLOGIC: No headache, seizures, numbness, tingling or weakness. PSYCHIATRIC: No depression, no loss of interest in normal activity or change in sleep pattern.     Exam:   BP 126/78   Ht 5' 1.5" (1.562 m)   Wt 132 lb (59.9 kg)   LMP 02/16/2016   BMI  24.54 kg/m   Body mass index is 24.54 kg/m.  General appearance : Well developed well nourished female. No acute distress HEENT: Eyes: no retinal hemorrhage or exudates,  Neck supple, trachea midline, no carotid bruits, no thyroidmegaly Lungs: Clear to auscultation, no rhonchi or wheezes, or rib retractions  Heart: Regular rate and rhythm, no murmurs or gallops Breast:Examined in sitting and supine position were symmetrical in appearance, no palpable masses or tenderness,  no skin retraction, no nipple inversion, no nipple discharge, no skin discoloration, no axillary or supraclavicular lymphadenopathy Abdomen: no palpable masses or tenderness, no rebound or guarding Extremities: no edema or skin discoloration or tenderness  Pelvic: Vulva: Normal             Vagina: No gross lesions or discharge  Cervix: No gross lesions or discharge  Uterus  AV, normal size, shape and consistency, non-tender and mobile  Adnexa  Without masses or tenderness  Anus: Normal   Assessment/Plan:  48 y.o. female for annual exam   1. Well female exam with routine gynecological exam Normal gynecologic exam in menopause.  Pap test November 2019 was negative, will repeat at 3 years.  Breast exam normal.  Screening mammogram January 2020 was negative.  Health labs with family physician.  Good body mass index at  24.54.  Continue with fitness and healthy nutrition.  2. Postmenopause Postmenopausal x about 1 year, well on no hormone replacement therapy.  No postmenopausal bleeding.  Recommend vitamin D supplements, calcium intake of 1200 mg daily and regular weightbearing physical activities.   Princess Bruins MD, 10:34 AM 05/17/2019

## 2019-05-17 NOTE — Patient Instructions (Signed)
1. Well female exam with routine gynecological exam Normal gynecologic exam in menopause.  Pap test November 2019 was negative, will repeat at 3 years.  Breast exam normal.  Screening mammogram January 2020 was negative.  Health labs with family physician.  Good body mass index at 24.54.  Continue with fitness and healthy nutrition.  2. Postmenopause Postmenopausal x about 1 year, well on no hormone replacement therapy.  No postmenopausal bleeding.  Recommend vitamin D supplements, calcium intake of 1200 mg daily and regular weightbearing physical activities.  Katherine Bird, it was a pleasure seeing you today!

## 2020-05-08 ENCOUNTER — Emergency Department (HOSPITAL_COMMUNITY)
Admission: EM | Admit: 2020-05-08 | Discharge: 2020-05-08 | Disposition: A | Discharge status: Home / Self Care | Payer: No Typology Code available for payment source | Attending: Emergency Medicine | Admitting: Emergency Medicine

## 2020-05-08 ENCOUNTER — Emergency Department (HOSPITAL_COMMUNITY): Payer: No Typology Code available for payment source

## 2020-05-08 ENCOUNTER — Other Ambulatory Visit: Payer: Self-pay

## 2020-05-08 ENCOUNTER — Encounter (HOSPITAL_COMMUNITY): Payer: Self-pay

## 2020-05-08 DIAGNOSIS — K5732 Diverticulitis of large intestine without perforation or abscess without bleeding: Secondary | ICD-10-CM | POA: Insufficient documentation

## 2020-05-08 LAB — COMPREHENSIVE METABOLIC PANEL
ALT: 19 U/L (ref 0–44)
AST: 24 U/L (ref 15–41)
Albumin: 4.1 g/dL (ref 3.5–5.0)
Alkaline Phosphatase: 60 U/L (ref 38–126)
Anion gap: 14 (ref 5–15)
BUN: 5 mg/dL — ABNORMAL LOW (ref 6–20)
CO2: 20 mmol/L — ABNORMAL LOW (ref 22–32)
Calcium: 9.3 mg/dL (ref 8.9–10.3)
Chloride: 98 mmol/L (ref 98–111)
Creatinine, Ser: 0.71 mg/dL (ref 0.44–1.00)
GFR, Estimated: >60 mL/min (ref >60)
Glucose, Bld: 149 mg/dL — ABNORMAL HIGH (ref 70–99)
Potassium: 3.5 mmol/L (ref 3.5–5.1)
Sodium: 132 mmol/L — ABNORMAL LOW (ref 135–145)
Total Bilirubin: 1.1 mg/dL (ref 0.3–1.2)
Total Protein: 7.2 g/dL (ref 6.5–8.1)

## 2020-05-08 LAB — URINALYSIS, ROUTINE W REFLEX MICROSCOPIC
Bacteria, UA: NONE SEEN
Bilirubin Urine: NEGATIVE
Glucose, UA: NEGATIVE mg/dL
Hgb urine dipstick: NEGATIVE
Ketones, ur: 80 mg/dL — AB
Leukocytes,Ua: NEGATIVE
Nitrite: NEGATIVE
Protein, ur: 100 mg/dL — AB
Specific Gravity, Urine: 1.019 (ref 1.005–1.030)
pH: 9 — ABNORMAL HIGH (ref 5.0–8.0)

## 2020-05-08 LAB — CBC
HCT: 40.2 % (ref 36.0–46.0)
Hemoglobin: 13.7 g/dL (ref 12.0–15.0)
MCH: 30.5 pg (ref 26.0–34.0)
MCHC: 34.1 g/dL (ref 30.0–36.0)
MCV: 89.5 fL (ref 80.0–100.0)
Platelets: 235 10*3/uL (ref 150–400)
RBC: 4.49 MIL/uL (ref 3.87–5.11)
RDW: 12.2 % (ref 11.5–15.5)
WBC: 13.2 10*3/uL — ABNORMAL HIGH (ref 4.0–10.5)
nRBC: 0 % (ref 0.0–0.2)

## 2020-05-08 LAB — LIPASE, BLOOD: Lipase: 21 U/L (ref 11–51)

## 2020-05-08 LAB — I-STAT BETA HCG BLOOD, ED (MC, WL, AP ONLY): I-stat hCG, quantitative: <5 m[IU]/mL (ref <5)

## 2020-05-08 MED ORDER — SODIUM CHLORIDE 0.9 % IV SOLN: INTRAVENOUS | Status: DC

## 2020-05-08 MED ORDER — CIPROFLOXACIN HCL 500 MG PO TABS: 500.0000 mg | ORAL_TABLET | Freq: Two times a day (BID) | ORAL | 0 refills | Status: DC

## 2020-05-08 MED ORDER — ONDANSETRON HCL 4 MG/2ML IJ SOLN
4.0000 mg | Freq: Once | INTRAMUSCULAR | Status: AC
  Administered 2020-05-08: 4 mg via INTRAVENOUS
  Filled 2020-05-08: qty 2

## 2020-05-08 MED ORDER — SODIUM CHLORIDE 0.9 % IV BOLUS
1000.0000 mL | Freq: Once | INTRAVENOUS | Status: AC
  Administered 2020-05-08: 1000 mL via INTRAVENOUS

## 2020-05-08 MED ORDER — CIPROFLOXACIN HCL 500 MG PO TABS
500.0000 mg | ORAL_TABLET | Freq: Once | ORAL | Status: AC
  Administered 2020-05-08: 500 mg via ORAL
  Filled 2020-05-08: qty 1

## 2020-05-08 MED ORDER — METRONIDAZOLE 500 MG PO TABS
500.0000 mg | ORAL_TABLET | Freq: Once | ORAL | Status: AC
  Administered 2020-05-08: 500 mg via ORAL
  Filled 2020-05-08: qty 1

## 2020-05-08 MED ORDER — ONDANSETRON 4 MG PO TBDP: 4.0000 mg | ORAL_TABLET | Freq: Three times a day (TID) | ORAL | 0 refills | Status: DC | PRN

## 2020-05-08 MED ORDER — IOHEXOL 300 MG/ML  SOLN
100.0000 mL | Freq: Once | INTRAMUSCULAR | Status: AC | PRN
  Administered 2020-05-08: 100 mL via INTRAVENOUS

## 2020-05-08 MED ORDER — HYDROMORPHONE HCL 1 MG/ML IJ SOLN
0.5000 mg | Freq: Once | INTRAMUSCULAR | Status: AC
Start: 2020-05-08 — End: 2020-05-08
  Administered 2020-05-08: 0.5 mg via INTRAVENOUS
  Filled 2020-05-08: qty 1

## 2020-05-08 MED ORDER — HYDROCODONE-ACETAMINOPHEN 5-325 MG PO TABS: 1.0000 | ORAL_TABLET | Freq: Four times a day (QID) | ORAL | 0 refills | Status: DC | PRN

## 2020-05-08 MED ORDER — METRONIDAZOLE 500 MG PO TABS
500.0000 mg | ORAL_TABLET | Freq: Three times a day (TID) | ORAL | 0 refills | Status: AC
Start: 2020-05-08 — End: 2020-05-15

## 2020-05-08 NOTE — ED Triage Notes (Signed)
Patient complains of generalized abdominal pain with vomiting and chills x 1 day, no diarrhea. Patient states has had same in past but worse this time

## 2020-05-08 NOTE — ED Provider Notes (Signed)
[facility] EMERGENCY DEPARTMENT Provider Note   CSN: [id number] Arrival date & time: 05/08/20  6834     History No chief complaint on file.   Katherine Bird is a 49 y.o. female.  Patient with onset of abdominal pain mostly in the lower quadrants yesterday.  Has been constant in nature.  But will wax and wane.  Associated with vomiting no diarrhea.  No blood in the vomit.  No fevers.        Past Medical History:  Diagnosis Date  . Heart murmur     history of small VSD    Patient Active Problem List   Diagnosis Date Noted  . IUD (intrauterine device) in place 02/18/2016  . 's 02/05/2016    Past Surgical History:  Procedure Laterality Date  . CHOLECYSTECTOMY  2018     OB History    Gravida  3   Para  2   Term      Preterm      AB  1   Living  2     SAB      IAB      Ectopic      Multiple      Live Births              Family History  Problem Relation Age of Onset  . Breast cancer Neg Hx     Social History   Tobacco Use  . Smoking status: Never Smoker  . Smokeless tobacco: Never Used  Vaping Use  . Vaping Use: Never used  Substance Use Topics  . Alcohol use: No    Home Medications Prior to Admission medications   Medication Sig Start Date End Date Taking? Authorizing Provider  Multiple Vitamin (MULTIVITAMIN) tablet Take 1 tablet by mouth daily.   Yes [provider]    Allergies    Patient has no known allergies.  Review of Systems   Review of Systems  Constitutional: Negative for chills and fever.  HENT: Negative for congestion, rhinorrhea and sore throat.   Eyes: Negative for visual disturbance.  Respiratory: Negative for cough and shortness of breath.   Cardiovascular: Negative for chest pain and leg swelling.  Gastrointestinal: Positive for abdominal pain, nausea and vomiting. Negative for diarrhea.  Genitourinary: Negative for dysuria.  Musculoskeletal: Negative for back pain and  neck pain.  Skin: Negative for rash.  Neurological: Negative for dizziness, light-headedness and headaches.  Hematological: Does not bruise/bleed easily.  Psychiatric/Behavioral: Negative for confusion.    Physical Exam Updated Vital Signs BP (!) 105/57   Pulse 95   Temp 98.7 F (37.1 C) (Oral)   Resp (!) 21   LMP 02/16/2016   SpO2 95%   Physical Exam Vitals and nursing note reviewed.  Constitutional:      General: She is not in acute distress.    Appearance: Normal appearance. She is well-developed and well-nourished.  HENT:     Head: Normocephalic and atraumatic.  Eyes:     Extraocular Movements: Extraocular movements intact.     Conjunctiva/sclera: Conjunctivae normal.     Pupils: Pupils are equal, round, and reactive to light.  Cardiovascular:     Rate and Rhythm: Normal rate and regular rhythm.     Heart sounds: No murmur heard.   Pulmonary:     Effort: Pulmonary effort is normal. No respiratory distress.     Breath sounds: Normal breath sounds.  Abdominal:     Palpations: Abdomen is soft.  Tenderness: There is abdominal tenderness. There is no guarding.     Comments: LQ tenderness  Musculoskeletal:        General: No edema. Normal range of motion.     Cervical back: Normal range of motion and neck supple.  Skin:    General: Skin is warm and dry.     Capillary Refill: Capillary refill takes less than 2 seconds.  Neurological:     General: No focal deficit present.     Mental Status: She is alert and oriented to person, place, and time.     Cranial Nerves: No cranial nerve deficit.     Sensory: No sensory deficit.     Motor: No weakness.  Psychiatric:        Mood and Affect: Mood and affect normal.     ED Results / Procedures / Treatments   Labs (all labs ordered are listed, but only abnormal results are displayed) Labs Reviewed  COMPREHENSIVE METABOLIC PANEL - Abnormal; Notable for the following components:      Result Value   Sodium 132 (*)     CO2 20 (*)    Glucose, Bld 149 (*)    BUN 5 (*)    All other components within normal limits  CBC - Abnormal; Notable for the following components:   WBC 13.2 (*)    All other components within normal limits  URINALYSIS, ROUTINE W REFLEX MICROSCOPIC - Abnormal; Notable for the following components:   APPearance HAZY (*)    pH 9.0 (*)    Ketones, ur 80 (*)    Protein, ur 100 (*)    All other components within normal limits  URINE CULTURE  LIPASE, BLOOD  I-STAT BETA HCG BLOOD, ED ([facility], [facility], [facility] ONLY)    EKG None  Radiology CT Abdomen Pelvis W Contrast  Result Date: 05/08/2020 CLINICAL DATA:  Generalized abdominal pain with vomiting and chills for 1 day. EXAM: CT ABDOMEN AND PELVIS WITH CONTRAST TECHNIQUE: Multidetector CT imaging of the abdomen and pelvis was performed using the standard protocol following bolus administration of intravenous contrast. CONTRAST:  155mL OMNIPAQUE IOHEXOL 300 MG/ML  SOLN COMPARISON:  None. FINDINGS: Lower chest: Subpleural scar-like appearance at the right middle lobe. Minimal opacity in the paramediastinal right lower lobe is likely also scarring related to adjacent osteophyte. Hepatobiliary: Small central liver cyst.Cholecystectomy with prominent bile ducts that is likely from reservoir effect given biliary labs. The CBD measures up to 1 cm. No calcified choledocholithiasis. Pancreas: Unremarkable. Spleen: Unremarkable. Adrenals/Urinary Tract: Negative adrenals. No hydronephrosis or stone. Unremarkable bladder. Stomach/Bowel: Short segment proximal sigmoid submucosal edema and thickening centered on thickened an indistinct diverticulum. Regional fat stranding without abscess. No pneumoperitoneum. Colonic diverticulosis is essentially limited to the sigmoid. Normal appendix . Vascular/Lymphatic: No acute vascular abnormality. No mass or adenopathy. Reproductive:No pathologic findings. Other: No ascites or pneumoperitoneum. Musculoskeletal: No acute abnormalities.  Focal L5-S1 lumbar disc degeneration. Lower thoracic spondylosis. IMPRESSION: Sigmoid diverticulitis without abscess or pneumoperitoneum. Electronically Signed   By: Monte Fantasia M.D.   On: 05/08/2020 09:46    Procedures Procedures (including critical care time)  Medications Ordered in ED Medications  0.9 %  sodium chloride infusion (has no administration in time range)  sodium chloride 0.9 % bolus 1,000 mL (1,000 mLs Intravenous New Bag/Given 05/08/20 0858)  ondansetron (ZOFRAN) injection 4 mg (4 mg Intravenous Given 05/08/20 0858)  HYDROmorphone (DILAUDID) injection 0.5 mg (0.5 mg Intravenous Given 05/08/20 0859)  iohexol (OMNIPAQUE) 300 MG/ML solution 100 mL (100 mLs  Intravenous Contrast Given 05/08/20 1470)    ED Course  I have reviewed the triage vital signs and the nursing notes.  Pertinent labs & imaging results that were available during my care of the patient were reviewed by me and considered in my medical decision making (see chart for details).    MDM Rules/Calculators/A&P                          Labs significant for leukocytosis.  No significant electrolyte abnormalities.  Other than sodium slightly low at 132 but not critical.  CT scan consistent with sigmoid diverticulitis without any complicating factors.  Will treat with Cipro and Flagyl orally.  Provide patient with pain medication antinausea medicine to take at home.  Expect improvement over the next few days.  Patient will return for any new or worse symptoms.  Due to the vomiting patient received IV fluid hydration here.  Final Clinical Impression(s) / ED Diagnoses Final diagnoses:  Diverticulitis of large intestine without perforation or abscess without bleeding    Rx / DC Orders ED Discharge Orders    None       Fredia Sorrow, MD 05/08/20 1000

## 2020-05-08 NOTE — Discharge Instructions (Signed)
CAT scan showed evidence of diverticulitis without any complications.  This should improve on the antibiotics over the next 2 to 4 days.  Return for any new or worse symptoms or if not improving or if vomiting prohibit you from taking the antibiotic.

## 2020-05-08 NOTE — ED Notes (Signed)
Patient verbalizes understanding of discharge instructions. Opportunity for questioning and answers were provided. Arm band removed by staff, patient discharged from ED. 

## 2020-05-09 LAB — URINE CULTURE: Culture: <10000 — AB

## 2020-05-20 ENCOUNTER — Other Ambulatory Visit: Payer: Self-pay

## 2020-05-20 ENCOUNTER — Ambulatory Visit (INDEPENDENT_AMBULATORY_CARE_PROVIDER_SITE_OTHER): Payer: Self-pay | Admitting: Obstetrics & Gynecology

## 2020-05-20 ENCOUNTER — Encounter: Payer: Self-pay | Admitting: Obstetrics & Gynecology

## 2020-05-20 VITALS — BP 120/78 | Ht 61.0 in | Wt 137.0 lb

## 2020-05-20 DIAGNOSIS — K5792 Diverticulitis of intestine, part unspecified, without perforation or abscess without bleeding: Secondary | ICD-10-CM

## 2020-05-20 DIAGNOSIS — Z113 Encounter for screening for infections with a predominantly sexual mode of transmission: Secondary | ICD-10-CM

## 2020-05-20 DIAGNOSIS — Z01419 Encounter for gynecological examination (general) (routine) without abnormal findings: Secondary | ICD-10-CM

## 2020-05-20 DIAGNOSIS — Z780 Asymptomatic menopausal state: Secondary | ICD-10-CM

## 2020-05-20 NOTE — Addendum Note (Signed)
Addended by: Lorine Bears on: 05/20/2020 04:46 PM   Modules accepted: Orders

## 2020-05-20 NOTE — Progress Notes (Signed)
Katherine Bird 1971-04-01 [id number]   History:    49 y.o. G3P2A1L2 Separated.  2 sons.  [id number] presenting for annual gyn exam   HPI:  Postmenopausal x >2 years, well on no HRT.  No PMB. No pelvic pain.  Currently abstinent.  Used condoms per patient last time sexually active with husband. Breasts wnl. Urine/BMs wnl. BMI 25.89.  Good fitness.Health labs with Fam MD.   Past medical history,surgical history, family history and social history were all reviewed and documented in the EPIC chart.  Gynecologic History Patient's last menstrual period was 02/16/2016.  Obstetric History OB History  Gravida Para Term Preterm AB Living  3 2     1 2   SAB IAB Ectopic Multiple Live Births               # Outcome Date GA Lbr Len/2nd Weight Sex Delivery Anes PTL Lv  3 AB           2 Para           1 Para              ROS: A ROS was performed and pertinent positives and negatives are included in the history.  GENERAL: No fevers or chills. HEENT: No change in vision, no earache, sore throat or sinus congestion. NECK: No pain or stiffness. CARDIOVASCULAR: No chest pain or pressure. No palpitations. PULMONARY: No shortness of breath, cough or wheeze. GASTROINTESTINAL: No abdominal pain, nausea, vomiting or diarrhea, melena or bright red blood per rectum. GENITOURINARY: No urinary frequency, urgency, hesitancy or dysuria. MUSCULOSKELETAL: No joint or muscle pain, no back pain, no recent trauma. DERMATOLOGIC: No rash, no itching, no lesions. ENDOCRINE: No polyuria, polydipsia, no heat or cold intolerance. No recent change in weight. HEMATOLOGICAL: No anemia or easy bruising or bleeding. NEUROLOGIC: No headache, seizures, numbness, tingling or weakness. PSYCHIATRIC: No depression, no loss of interest in normal activity or change in sleep pattern.     Exam:   BP 120/78 (BP Location: Right Arm, Patient Position: Sitting, Cuff Size: Normal)   Ht 5\' 1"  (1.549 m)   Wt 137 lb  (62.1 kg)   LMP 02/16/2016   BMI 25.89 kg/m   Body mass index is 25.89 kg/m.  General appearance : Well developed well nourished female. No acute distress HEENT: Eyes: no retinal hemorrhage or exudates,  Neck supple, trachea midline, no carotid bruits, no thyroidmegaly Lungs: Clear to auscultation, no rhonchi or wheezes, or rib retractions  Heart: Regular rate and rhythm, no murmurs or gallops Breast:Examined in sitting and supine position were symmetrical in appearance, no palpable masses or tenderness,  no skin retraction, no nipple inversion, no nipple discharge, no skin discoloration, no axillary or supraclavicular lymphadenopathy Abdomen: no palpable masses or tenderness, no rebound or guarding Extremities: no edema or skin discoloration or tenderness  Pelvic: Vulva: Normal             Vagina: No gross lesions or discharge  Cervix: No gross lesions or discharge.  Pap/HPV HR/Gono-Chlam  Uterus  AV, normal size, shape and consistency, non-tender and mobile  Adnexa  Without masses or tenderness  Anus: Normal   Assessment/Plan:  49 y.o. female for annual exam   1. Encounter for routine gynecological examination with Papanicolaou smear of cervix Normal gynecologic exam in menopause.  Pap/HPV HR done.  Breasts normal.  Will schedule Screening Mammo.  BMI 25.89.  Recommend fitness year round.  Good nutrition.  Health labs with Fam MD.  2. Postmenopause Well on no HRT.  No PMB.  Vit D supplement, Ca++ 1500 mg daily, regular weight bearing physical activities.  3. Screen for STD (sexually transmitted disease) Gono-Chlam done on Pap.  Declines rest of STI screen.  4. Diverticulitis Referred to Gastro.  Nutrition discussed.  Princess Bruins MD, 4:05 PM 05/20/2020

## 2020-05-22 LAB — PAP IG, CT-NG NAA, HPV HIGH-RISK
C. trachomatis RNA, TMA: NOT DETECTED
HPV DNA High Risk: NOT DETECTED
N. gonorrhoeae RNA, TMA: NOT DETECTED

## 2020-08-18 IMAGING — MG DIGITAL SCREENING BILATERAL MAMMOGRAM WITH TOMO AND CAD
8 series · 9 of 24 positions shown · non-contrast
Comparison: Previous exam(s).

CLINICAL DATA: Screening.

EXAM:
DIGITAL SCREENING BILATERAL MAMMOGRAM WITH TOMO AND CAD

[R MLO synth-2D]
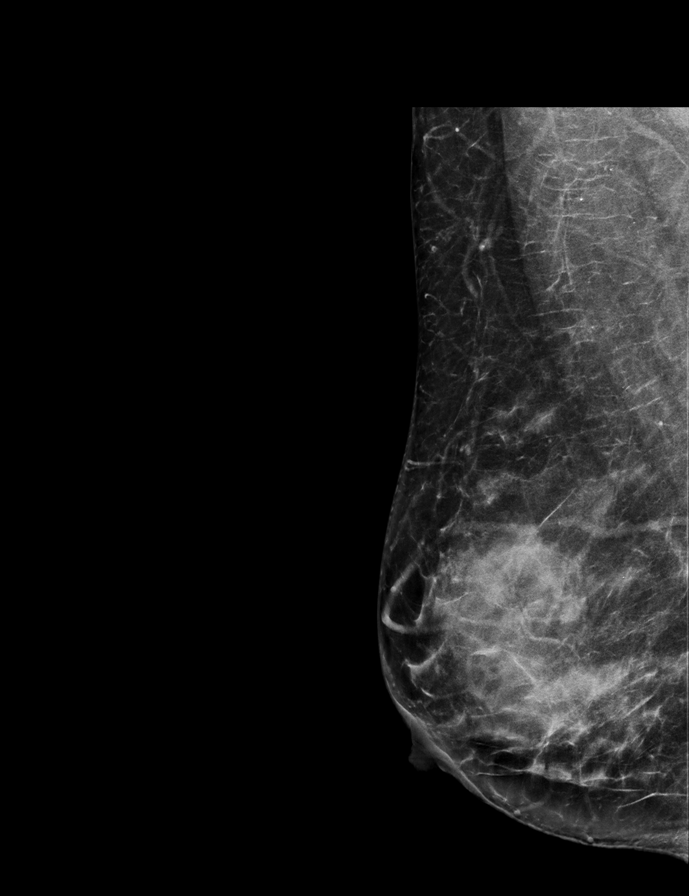

[L MLO synth-2D]
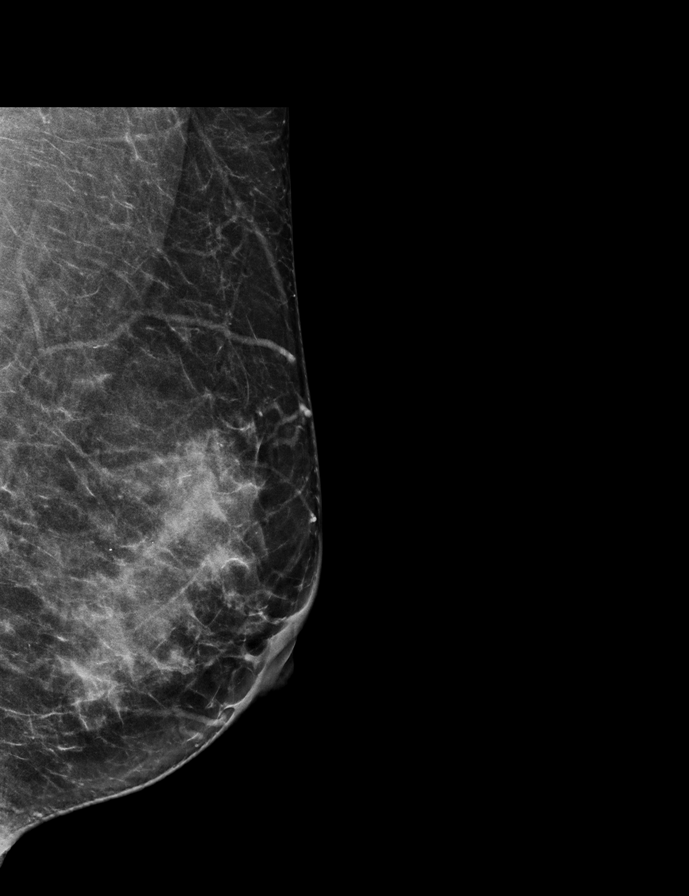

[R CC synth-2D]
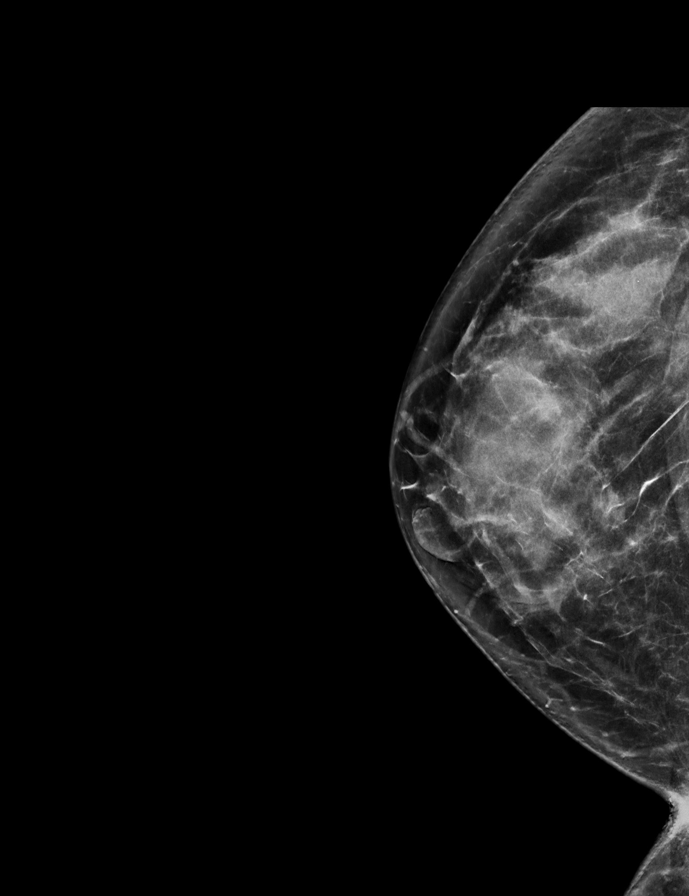

[L CC synth-2D]
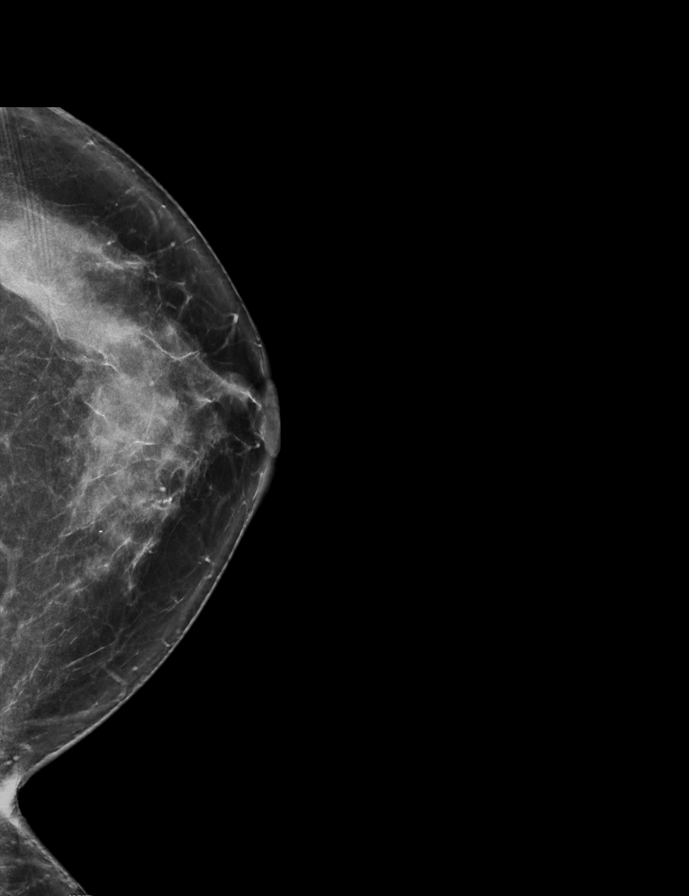

[L CC tomo · 2 of 75 frames shown]
[frame 25/75]
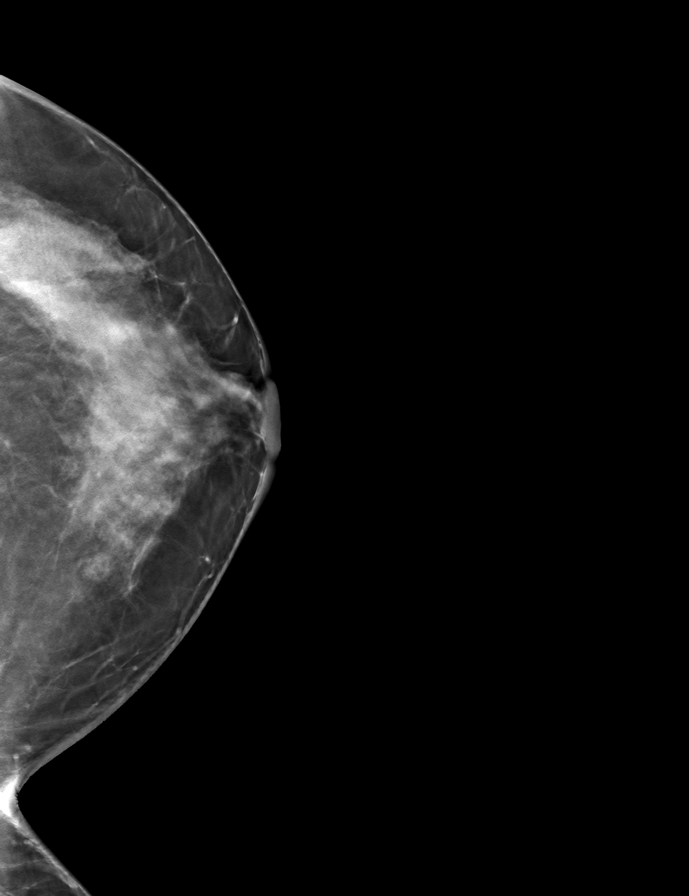
[frame 38/75]
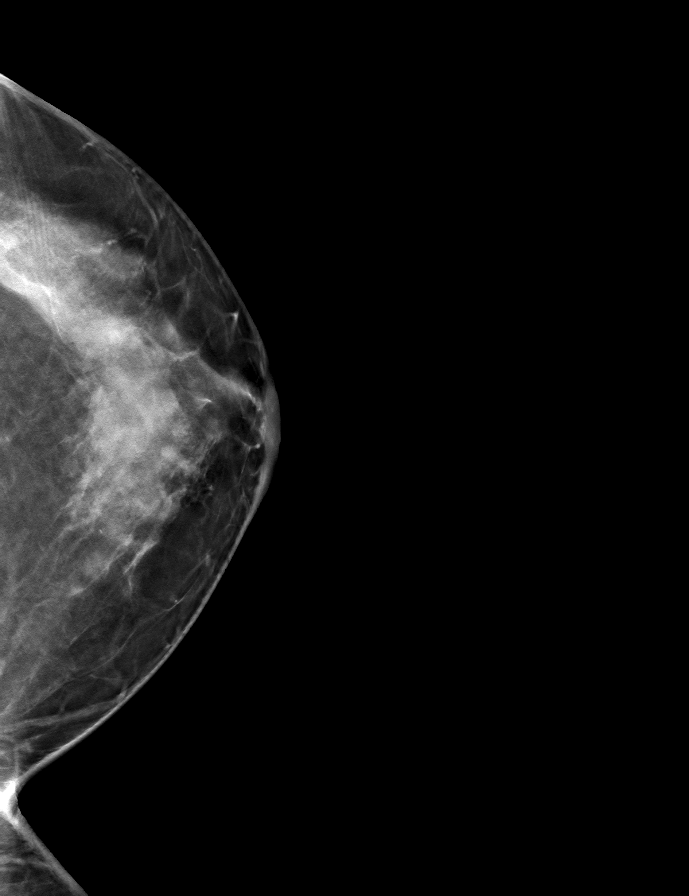

[R CC tomo · tomo slice 37/73.0]
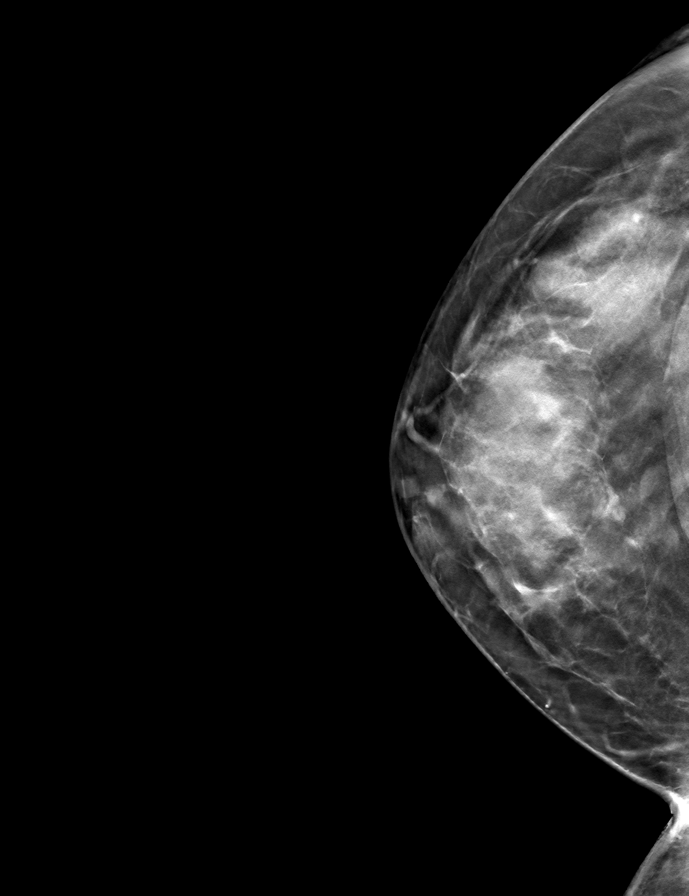

[R MLO tomo · tomo slice 37/73.0]
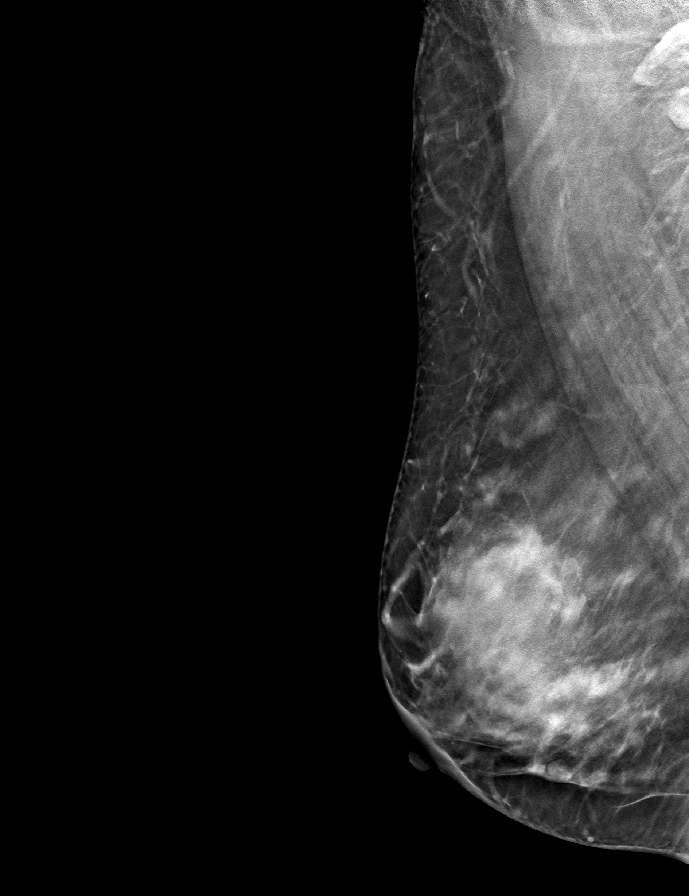

[L MLO tomo · tomo slice 33/66.0]
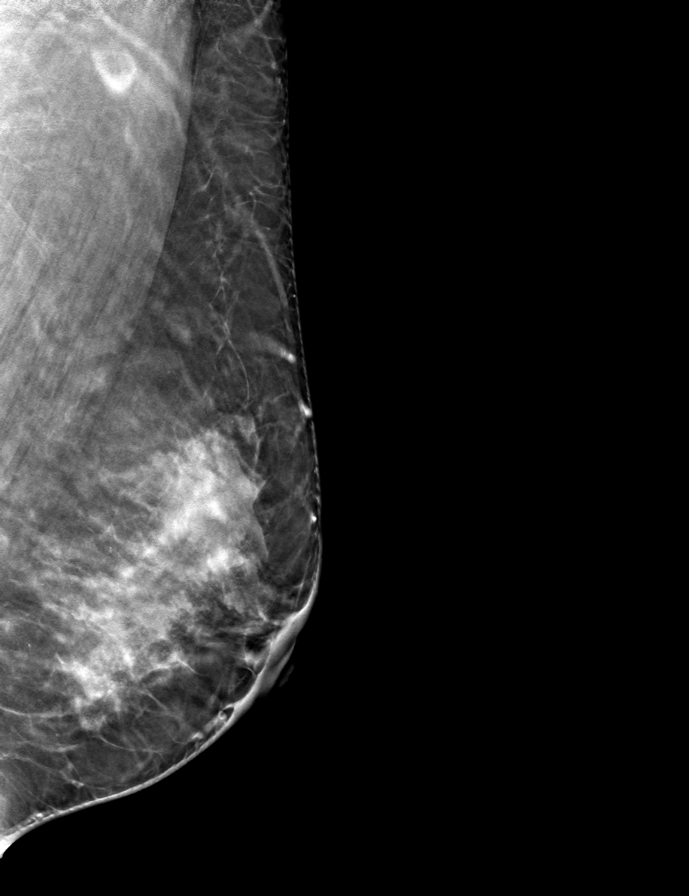

[9 of 24 positions shown; findings below may reference images not displayed]

ACR Breast Density Category d: The breast tissue is extremely dense,
which lowers the sensitivity of mammography
FINDINGS: There are no findings suspicious for malignancy. Images were
processed with CAD.
IMPRESSION: No mammographic evidence of malignancy. A result letter of this
screening mammogram will be mailed directly to the patient.

RECOMMENDATION:
Screening mammogram in one year. (Code:WO-0-ZI0)

BI-RADS CATEGORY  1: Negative.

## 2021-05-21 ENCOUNTER — Ambulatory Visit: Payer: Self-pay | Admitting: Obstetrics & Gynecology

## 2021-06-29 ENCOUNTER — Ambulatory Visit: Payer: Self-pay | Admitting: Obstetrics & Gynecology

## 2021-08-11 ENCOUNTER — Ambulatory Visit (INDEPENDENT_AMBULATORY_CARE_PROVIDER_SITE_OTHER): Payer: BC Managed Care – PPO | Admitting: Obstetrics & Gynecology

## 2021-08-11 ENCOUNTER — Other Ambulatory Visit: Payer: Self-pay

## 2021-08-11 ENCOUNTER — Encounter: Payer: Self-pay | Admitting: Obstetrics & Gynecology

## 2021-08-11 ENCOUNTER — Other Ambulatory Visit (HOSPITAL_COMMUNITY)
Admission: RE | Admit: 2021-08-11 | Discharge: 2021-08-11 | Disposition: A | Discharge status: Home / Self Care | Payer: No Typology Code available for payment source | Source: Ambulatory Visit | Attending: Obstetrics & Gynecology | Admitting: Obstetrics & Gynecology

## 2021-08-11 VITALS — BP 102/68 | HR 68 | Resp 16 | Ht 61.25 in | Wt 129.0 lb

## 2021-08-11 DIAGNOSIS — Z01419 Encounter for gynecological examination (general) (routine) without abnormal findings: Secondary | ICD-10-CM | POA: Insufficient documentation

## 2021-08-11 DIAGNOSIS — Z780 Asymptomatic menopausal state: Secondary | ICD-10-CM

## 2021-08-11 NOTE — Progress Notes (Signed)
? ? ?EMMERSEN GARRAWAY June 02, 1970 [id number] ? ? ?History:    51 y.o. [id number] Divorced.  Children doing well. ?  ?RP:  Established patient presenting for annual gyn exam  ?  ?HPI:  Postmenopausal x >3 years, well on no HRT.  No PMB.  No pelvic pain.  Currently abstinent.  Pap Neg 04/2020, no h/o abnormal Pap.  Pap reflex today. Breasts wnl. Overdue for mammo, will schedule now.  Urine/BMs wnl. BMI 24.18.  Good fitness. Health labs with Fam MD.  Will schedule a Colono with Dr Collene Mares. ?  ? ?Past medical history,surgical history, family history and social history were all reviewed and documented in the EPIC chart. ? ?Gynecologic History ?Patient's last menstrual period was 02/16/2016. ? ?Obstetric History ?OB History  ?Gravida Para Term Preterm AB Living  ?'3 2     1 2  '$ ?SAB IAB Ectopic Multiple Live Births  ?1          ?  ?# Outcome Date GA Lbr Len/2nd Weight Sex Delivery Anes PTL Lv  ?3 SAB           ?2 Para           ?1 Para           ? ? ? ?ROS: A ROS was performed and pertinent positives and negatives are included in the history. ? GENERAL: No fevers or chills. HEENT: No change in vision, no earache, sore throat or sinus congestion. NECK: No pain or stiffness. CARDIOVASCULAR: No chest pain or pressure. No palpitations. PULMONARY: No shortness of breath, cough or wheeze. GASTROINTESTINAL: No abdominal pain, nausea, vomiting or diarrhea, melena or bright red blood per rectum. GENITOURINARY: No urinary frequency, urgency, hesitancy or dysuria. MUSCULOSKELETAL: No joint or muscle pain, no back pain, no recent trauma. DERMATOLOGIC: No rash, no itching, no lesions. ENDOCRINE: No polyuria, polydipsia, no heat or cold intolerance. No recent change in weight. HEMATOLOGICAL: No anemia or easy bruising or bleeding. NEUROLOGIC: No headache, seizures, numbness, tingling or weakness. PSYCHIATRIC: No depression, no loss of interest in normal activity or change in sleep pattern.  ?  ? ?Exam: ? ? ?BP 102/68   Pulse 68   Resp 16    Ht 5' 1.25" (1.556 m)   Wt 129 lb (58.5 kg)   LMP 02/16/2016   BMI 24.18 kg/m?  ? ?Body mass index is 24.18 kg/m?. ? ?General appearance : Well developed well nourished female. No acute distress ?HEENT: Eyes: no retinal hemorrhage or exudates,  Neck supple, trachea midline, no carotid bruits, no thyroidmegaly ?Lungs: Clear to auscultation, no rhonchi or wheezes, or rib retractions  ?Heart: Regular rate and rhythm, no murmurs or gallops ?Breast:Examined in sitting and supine position were symmetrical in appearance, no palpable masses or tenderness,  no skin retraction, no nipple inversion, no nipple discharge, no skin discoloration, no axillary or supraclavicular lymphadenopathy ?Abdomen: no palpable masses or tenderness, no rebound or guarding ?Extremities: no edema or skin discoloration or tenderness ? ?Pelvic: Vulva: Normal ?            Vagina: No gross lesions or discharge ? Cervix: No gross lesions or discharge.  Pap reflex done. ? Uterus  AV, normal size, shape and consistency, non-tender and mobile ? Adnexa  Without masses or tenderness ? Anus: Normal ? ? ?Assessment/Plan:  51 y.o. female for annual exam  ? ?1. Encounter for routine gynecological examination with Papanicolaou smear of cervix ?Postmenopausal x >3 years, well on no HRT.  No PMB.  No pelvic pain.  Currently abstinent.  Pap Neg 04/2020, no h/o abnormal Pap.  Pap reflex today. Breasts wnl. Overdue for mammo, will schedule now.  Urine/BMs wnl. BMI 24.18.  Good fitness. Health labs with Fam MD.  Will schedule a Colono with Dr Collene Mares. ?- Cytology - PAP( [place]) ? ?2. Postmenopause  ?Postmenopausal x >3 years, well on no HRT.  No PMB.  Ca++, Vit D supplements, continue weight bearing physical activities. ? ?Princess Bruins MD, 8:27 AM 08/11/2021 ? ?  ?

## 2021-08-12 LAB — CYTOLOGY - PAP: Diagnosis: NEGATIVE

## 2022-07-11 IMAGING — CT CT ABD-PELV W/ CM
2 of 5 series · 17 of 46 positions shown, 19 images · IV contrast (omnipaque)
Comparison: None.

CLINICAL DATA: Generalized abdominal pain with vomiting and chills
for 1 day.

EXAM:
CT ABDOMEN AND PELVIS WITH CONTRAST
TECHNIQUE: Multidetector CT imaging of the abdomen and pelvis was performed
using the standard protocol following bolus administration of
intravenous contrast.
CONTRAST:  100mL OMNIPAQUE IOHEXOL 300 MG/ML  SOLN

[Series 3: abd/ pelvis 5.0 i30f 2 · axial · 0.90mm/px · z∈[+752,+1152]mm · 14 of 92 slices shown, 16 images]
[im 6/92  soft-tissue]
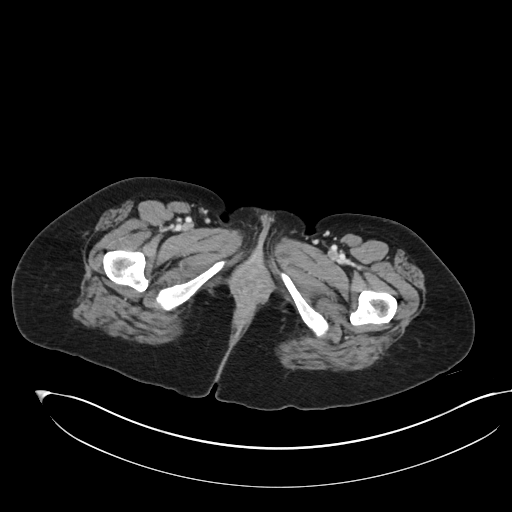
[im 6/92  bone]
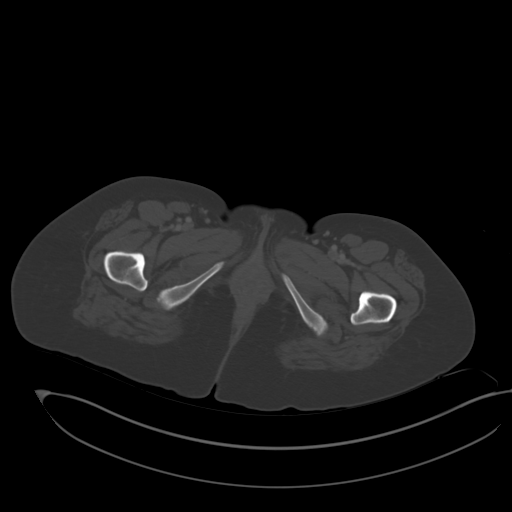
[im 11/92  soft-tissue]
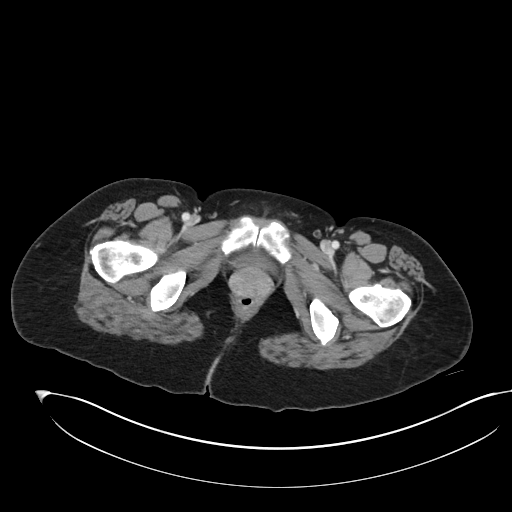
[im 21/92  soft-tissue]
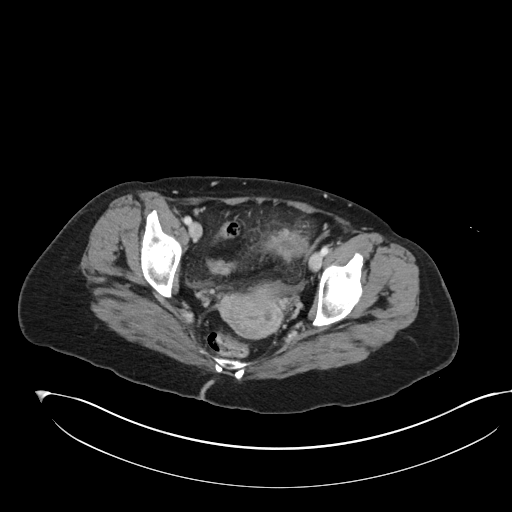
[im 26/92  soft-tissue]
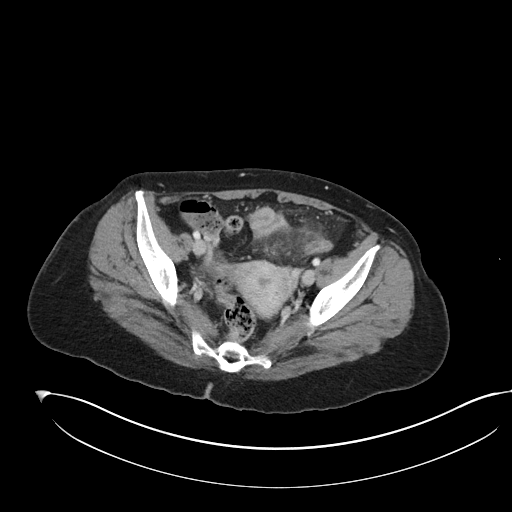
[im 31/92  soft-tissue]
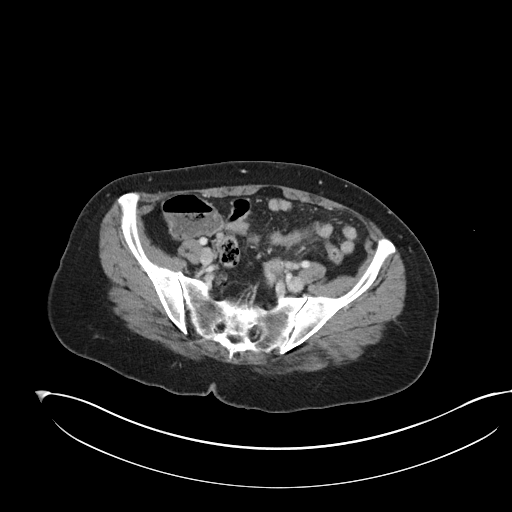
[im 36/92  soft-tissue]
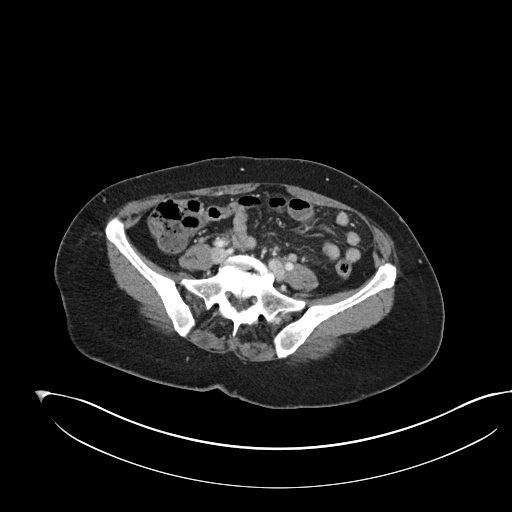
[im 41/92  soft-tissue]
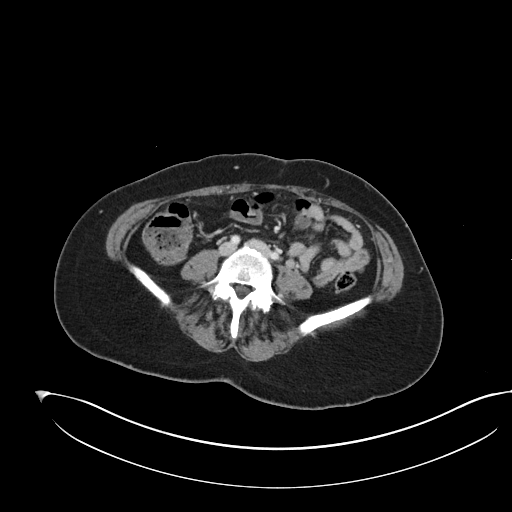
[im 51/92  soft-tissue]
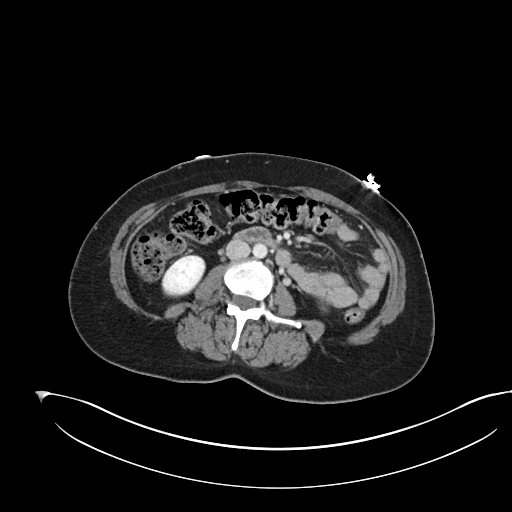
[im 56/92  soft-tissue]
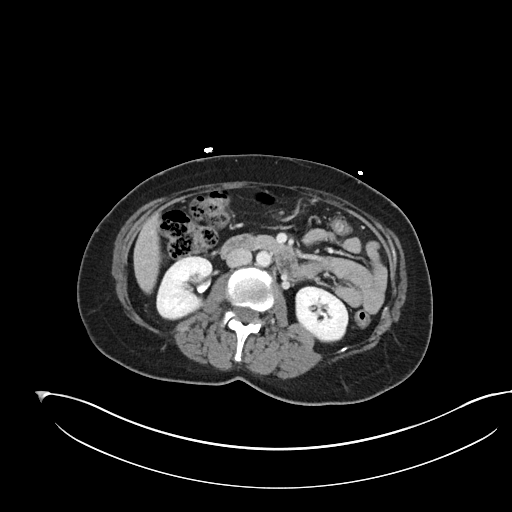
[im 56/92  bone]
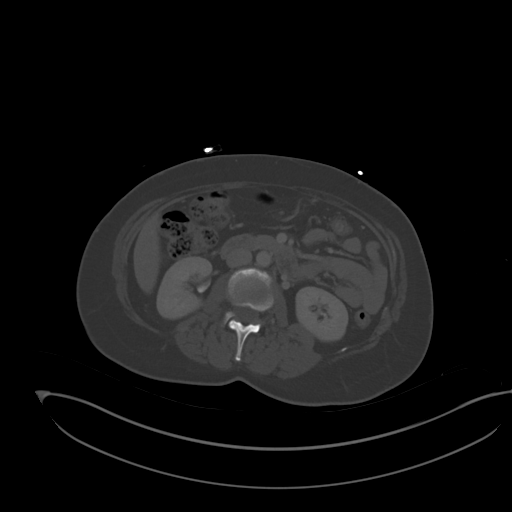
[im 61/92  soft-tissue]
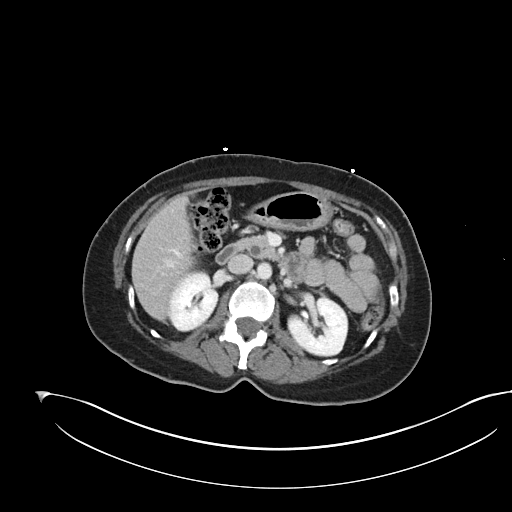
[im 66/92  soft-tissue]
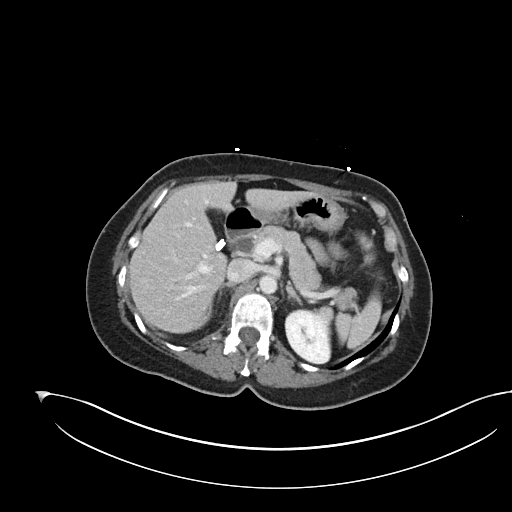
[im 71/92  soft-tissue]
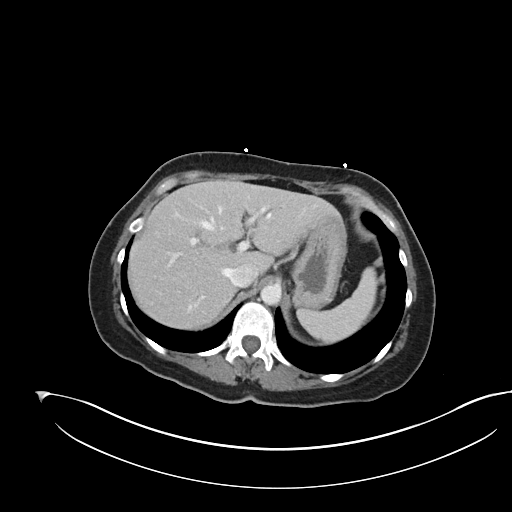
[im 81/92  soft-tissue]
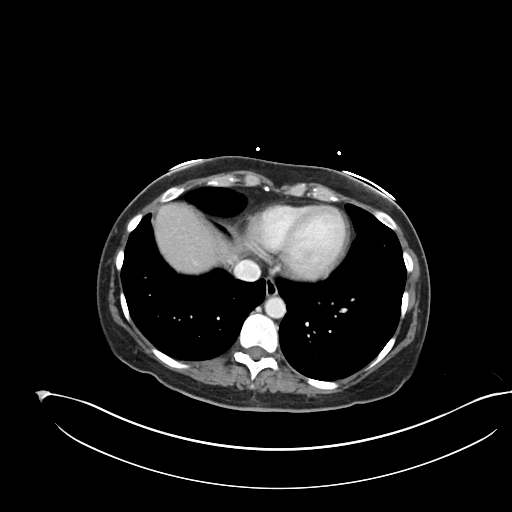
[im 86/92  soft-tissue]
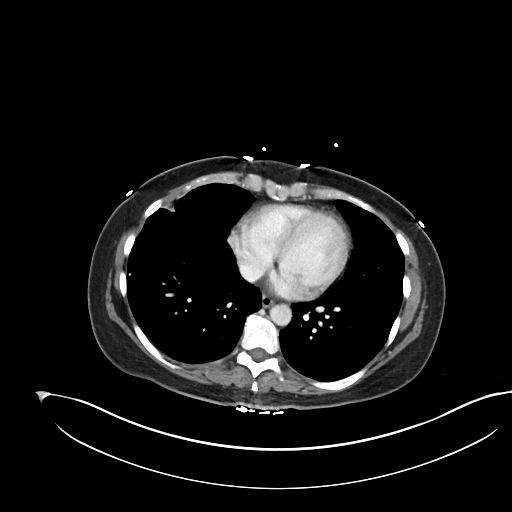

[Series 6: coronal soft tissue · coronal · 0.81mm/px · 3 of 91 slices shown]
[im 31/91  soft-tissue]
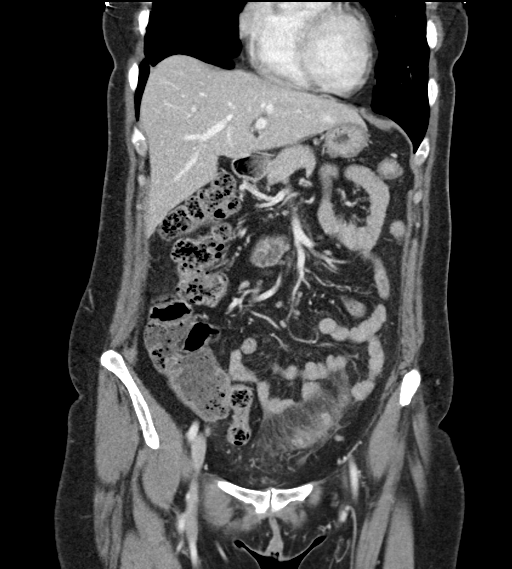
[im 41/91  soft-tissue]
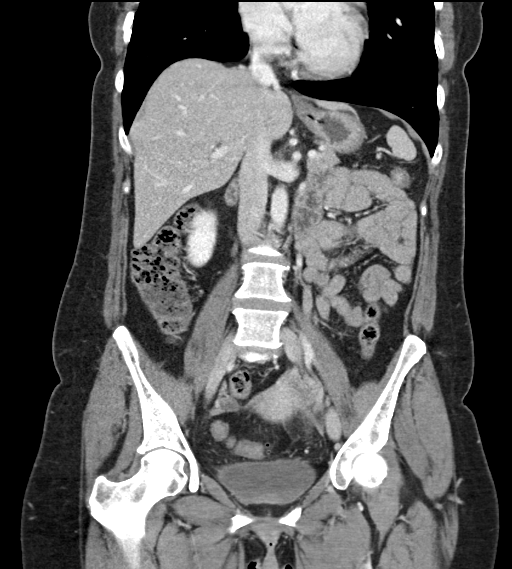
[im 51/91  soft-tissue]
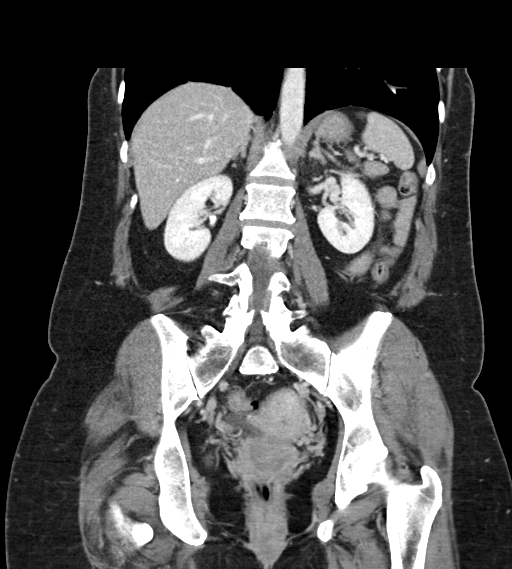

[17 of 46 positions shown; findings below may reference images not displayed]

FINDINGS: Lower chest: Subpleural scar-like appearance at the right middle
lobe. Minimal opacity in the paramediastinal right lower lobe is
likely also scarring related to adjacent osteophyte.

Hepatobiliary: Small central liver cyst.Cholecystectomy with
prominent bile ducts that is likely from reservoir effect given
biliary labs. The CBD measures up to 1 cm. No calcified
choledocholithiasis.

Pancreas: Unremarkable.

Spleen: Unremarkable.

Adrenals/Urinary Tract: Negative adrenals. No hydronephrosis or
stone. Unremarkable bladder.

Stomach/Bowel: Short segment proximal sigmoid submucosal edema and
thickening centered on thickened an indistinct diverticulum.
Regional fat stranding without abscess. No pneumoperitoneum. Colonic
diverticulosis is essentially limited to the sigmoid. Normal
appendix .

Vascular/Lymphatic: No acute vascular abnormality. No mass or
adenopathy.

Reproductive:No pathologic findings.

Other: No ascites or pneumoperitoneum.

Musculoskeletal: No acute abnormalities. Focal L5-S1 lumbar disc
degeneration. Lower thoracic spondylosis.
IMPRESSION: Sigmoid diverticulitis without abscess or pneumoperitoneum.

## 2022-08-13 ENCOUNTER — Encounter: Payer: Self-pay | Admitting: Obstetrics & Gynecology

## 2022-08-13 ENCOUNTER — Ambulatory Visit (INDEPENDENT_AMBULATORY_CARE_PROVIDER_SITE_OTHER): Payer: BC Managed Care – PPO | Admitting: Obstetrics & Gynecology

## 2022-08-13 VITALS — BP 120/80 | HR 83 | Ht 61.25 in | Wt 142.0 lb

## 2022-08-13 DIAGNOSIS — Z01419 Encounter for gynecological examination (general) (routine) without abnormal findings: Secondary | ICD-10-CM

## 2022-08-13 DIAGNOSIS — Z780 Asymptomatic menopausal state: Secondary | ICD-10-CM | POA: Diagnosis not present

## 2022-08-13 NOTE — Progress Notes (Signed)
Katherine Bird 08-Jul-1970 [id number]   History:    52 y.o.  [id number] Divorced.  Children doing well.   RP:  Established patient presenting for annual gyn exam    HPI:  Postmenopausal x >4 years, well on no HRT.  No PMB.  No pelvic pain.  Currently abstinent.  Pap Neg 07/2021, no h/o abnormal Pap. Will repeat Pap at 2-3 years. Breasts wnl. Overdue for mammo, will schedule now.  Urine/BMs wnl. BMI 26.61.  Good fitness. Health labs with Fam MD.  Will schedule a Colono with Dr Collene Mares.   Past medical history,surgical history, family history and social history were all reviewed and documented in the EPIC chart.  Gynecologic History Patient's last menstrual period was 02/16/2016.  Obstetric History OB History  Gravida Para Term Preterm AB Living  3 2 2   1 2   SAB IAB Ectopic Multiple Live Births  1            # Outcome Date GA Lbr Len/2nd Weight Sex Delivery Anes PTL Lv  3 SAB           2 Term           1 Term              ROS: A ROS was performed and pertinent positives and negatives are included in the history. GENERAL: No fevers or chills. HEENT: No change in vision, no earache, sore throat or sinus congestion. NECK: No pain or stiffness. CARDIOVASCULAR: No chest pain or pressure. No palpitations. PULMONARY: No shortness of breath, cough or wheeze. GASTROINTESTINAL: No abdominal pain, nausea, vomiting or diarrhea, melena or bright red blood per rectum. GENITOURINARY: No urinary frequency, urgency, hesitancy or dysuria. MUSCULOSKELETAL: No joint or muscle pain, no back pain, no recent trauma. DERMATOLOGIC: No rash, no itching, no lesions. ENDOCRINE: No polyuria, polydipsia, no heat or cold intolerance. No recent change in weight. HEMATOLOGICAL: No anemia or easy bruising or bleeding. NEUROLOGIC: No headache, seizures, numbness, tingling or weakness. PSYCHIATRIC: No depression, no loss of interest in normal activity or change in sleep pattern.     Exam:   BP 120/80   Pulse 83    Ht 5' 1.25" (1.556 m)   Wt 142 lb (64.4 kg)   LMP 02/16/2016 Comment: not sexually active  SpO2 99%   BMI 26.61 kg/m   Body mass index is 26.61 kg/m.  General appearance : Well developed well nourished female. No acute distress HEENT: Eyes: no retinal hemorrhage or exudates,  Neck supple, trachea midline, no carotid bruits, no thyroidmegaly Lungs: Clear to auscultation, no rhonchi or wheezes, or rib retractions  Heart: Regular rate and rhythm, no murmurs or gallops Breast:Examined in sitting and supine position were symmetrical in appearance, no palpable masses or tenderness,  no skin retraction, no nipple inversion, no nipple discharge, no skin discoloration, no axillary or supraclavicular lymphadenopathy Abdomen: no palpable masses or tenderness, no rebound or guarding Extremities: no edema or skin discoloration or tenderness  Pelvic: Vulva: Normal             Vagina: No gross lesions or discharge  Cervix: No gross lesions or discharge  Uterus  AV, normal size, shape and consistency, non-tender and mobile  Adnexa  Without masses or tenderness  Anus: Normal   Assessment/Plan:  52 y.o. female for annual exam   1. Well female exam with routine gynecological exam Postmenopausal x >4 years, well on no HRT.  No PMB.  No pelvic pain.  Currently  abstinent.  Pap Neg 07/2021, no h/o abnormal Pap. Will repeat Pap at 2-3 years. Breasts wnl. Overdue for mammo, will schedule now.  Urine/BMs wnl. BMI 26.61.  Good fitness. Health labs with Fam MD.  Will schedule a Colono with Dr Collene Mares.  2. Postmenopause  Postmenopausal x >4 years, well on no HRT.  No PMB.  No pelvic pain.  Currently abstinent.   Princess Bruins MD, 4:10 PM

## 2023-03-22 LAB — COLOGUARD

## 2023-06-27 ENCOUNTER — Ambulatory Visit (HOSPITAL_COMMUNITY)
Admission: EM | Admit: 2023-06-27 | Discharge: 2023-06-27 | Disposition: A | Discharge status: Home / Self Care | Payer: 59 | Attending: Physician Assistant | Admitting: Physician Assistant

## 2023-06-27 ENCOUNTER — Encounter (HOSPITAL_COMMUNITY): Payer: Self-pay

## 2023-06-27 DIAGNOSIS — J069 Acute upper respiratory infection, unspecified: Secondary | ICD-10-CM

## 2023-06-27 LAB — POC COVID19/FLU A&B COMBO
Covid Antigen, POC: NEGATIVE
Influenza A Antigen, POC: POSITIVE — AB
Influenza B Antigen, POC: NEGATIVE

## 2023-06-27 MED ORDER — ONDANSETRON 4 MG PO TBDP: 4.0000 mg | ORAL_TABLET | Freq: Three times a day (TID) | ORAL | 0 refills | Status: AC | PRN

## 2023-06-27 MED ORDER — ACETAMINOPHEN 325 MG PO TABS
650.0000 mg | ORAL_TABLET | Freq: Once | ORAL | Status: AC
  Administered 2023-06-27: 650 mg via ORAL

## 2023-06-27 MED ORDER — OSELTAMIVIR PHOSPHATE 75 MG PO CAPS: 75.0000 mg | ORAL_CAPSULE | Freq: Two times a day (BID) | ORAL | 0 refills | Status: AC

## 2023-06-27 MED ORDER — ACETAMINOPHEN 325 MG PO TABS
ORAL_TABLET | ORAL | Status: AC
  Filled 2023-06-27: qty 2

## 2023-06-27 NOTE — ED Provider Notes (Signed)
[facility]    CSN: [id number] Arrival date & time: 06/27/23  1005      History   Chief Complaint Chief Complaint  Patient presents with  . Fever  . Cough    HPI Katherine Bird is a 53 y.o. female.   HPI   She reports symptoms started on Sat with headaches, body aches, chills, fever Tmax; 103 She also reports ear pain and nasal congestion  She is also having productive cough and postnasal drainage  She reports nausea and vomiting as well as reduced appetite. She has been trying to stay well hydrated  Recent sick contacts: her son was positive for flu recently and she was providing care for him during illness Interventions: Theraflu and Ibuprofen   Past Medical History:  Diagnosis Date  . Heart murmur     history of small VSD    Patient Active Problem List   Diagnosis Date Noted  . IUD (intrauterine device) in place 02/18/2016  . 's 02/05/2016    Past Surgical History:  Procedure Laterality Date  . CHOLECYSTECTOMY  2018    OB History     Gravida  3   Para  2   Term  2   Preterm      AB  1   Living  2      SAB  1   IAB      Ectopic      Multiple      Live Births               Home Medications    Prior to Admission medications   Medication Sig Start Date End Date Taking? Authorizing Provider  ondansetron (ZOFRAN-ODT) 4 MG disintegrating tablet Take 1 tablet (4 mg total) by mouth every 8 (eight) hours as needed for nausea or vomiting. 06/27/23  Yes Aloysuis Ribaudo, Oswaldo Conroy, PA-C    Family History History reviewed. No pertinent family history.  Social History Social History   Tobacco Use  . Smoking status: Never  . Smokeless tobacco: Never  Vaping Use  . Vaping status: Never Used  Substance Use Topics  . Alcohol use: No  . Drug use: Never     Allergies   Patient has no known allergies.   Review of Systems Review of Systems  Constitutional:  Positive for appetite change, chills, fatigue and fever.  HENT:   Positive for congestion, ear pain, postnasal drip and sore throat.   Eyes:  Positive for pain.  Respiratory:  Positive for cough.   Gastrointestinal:  Positive for nausea and vomiting. Negative for diarrhea.  Musculoskeletal:  Positive for myalgias.  Neurological:  Positive for headaches.     Physical Exam Triage Vital Signs ED Triage Vitals  Encounter Vitals Group     BP 06/27/23 1126 (!) 153/75     Systolic BP Percentile --      Diastolic BP Percentile --      Pulse Rate 06/27/23 1126 (!) 128     Resp 06/27/23 1126 16     Temp 06/27/23 1126 (!) 103 F (39.4 C)     Temp Source 06/27/23 1126 Oral     SpO2 06/27/23 1126 95 %     Weight 06/27/23 1126 140 lb (63.5 kg)     Height 06/27/23 1126 5\' 2"  (1.575 m)     Head Circumference --      Peak Flow --      Pain Score 06/27/23 1127 10     Pain  Loc --      Pain Education --      Exclude from Growth Chart --    No data found.  Updated Vital Signs BP (!) 153/75 (BP Location: Left Arm)   Pulse (!) 128   Temp (!) 103 F (39.4 C) (Oral)   Resp 16   Ht 5\' 2"  (1.575 m)   Wt 140 lb (63.5 kg)   LMP 02/16/2016 Comment: not sexually active  SpO2 95%   BMI 25.61 kg/m   Visual Acuity Right Eye Distance:   Left Eye Distance:   Bilateral Distance:    Right Eye Near:   Left Eye Near:    Bilateral Near:     Physical Exam Vitals reviewed.  Constitutional:      General: She is awake.     Appearance: Normal appearance. She is well-developed and well-groomed.  HENT:     Head: Normocephalic and atraumatic.     Right Ear: Hearing, tympanic membrane and ear canal normal.     Left Ear: Hearing, tympanic membrane and ear canal normal.     Mouth/Throat:     Lips: Pink.     Mouth: Mucous membranes are moist.     Pharynx: Oropharynx is clear. Uvula midline. No pharyngeal swelling, oropharyngeal exudate, posterior oropharyngeal erythema, uvula swelling or postnasal drip.  Cardiovascular:     Rate and Rhythm: Regular rhythm.  Tachycardia present.     Pulses: Normal pulses.          Radial pulses are 2+ on the right side and 2+ on the left side.     Heart sounds: Normal heart sounds. No murmur heard.    No friction rub. No gallop.  Pulmonary:     Effort: Pulmonary effort is normal.     Breath sounds: Normal breath sounds. No decreased air movement. No decreased breath sounds, wheezing, rhonchi or rales.  Musculoskeletal:     Cervical back: Normal range of motion and neck supple.  Lymphadenopathy:     Head:     Right side of head: No submental, submandibular or preauricular adenopathy.     Left side of head: No submental, submandibular or preauricular adenopathy.     Cervical:     Right cervical: No superficial cervical adenopathy.    Left cervical: No superficial cervical adenopathy.     Upper Body:     Right upper body: No supraclavicular adenopathy.     Left upper body: No supraclavicular adenopathy.  Neurological:     Mental Status: She is alert.  Psychiatric:        Behavior: Behavior is cooperative.      UC Treatments / Results  Labs (all labs ordered are listed, but only abnormal results are displayed) Labs Reviewed  POC COVID19/FLU A&B COMBO    EKG   Radiology No results found.  Procedures Procedures (including critical care time)  Medications Ordered in UC Medications  acetaminophen (TYLENOL) tablet 650 mg (650 mg Oral Given 06/27/23 1132)    Initial Impression / Assessment and Plan / UC Course  I have reviewed the triage vital signs and the nursing notes.  Pertinent labs & imaging results that were available during my care of the patient were reviewed by me and considered in my medical decision making (see chart for details).  Clinical Course as of 06/27/23 1237  Mon Jun 27, 2023  1236 POC Covid19/Flu A&B Antigen [EM]    Clinical Course User Index [EM] Shellie Rogoff, Oswaldo Conroy, PA-C    Patient  presents with chills, fever, cough and concerns for recent Flu exposure Will provide  Tylenol 650 mg in clinic for fever management. Testing for COVID and flu ordered as well. Suspect tachycardia is likely secondary to mild dehydration and fever. No murmurs or rubs noted.   Final Clinical Impressions(s) / UC Diagnoses   Final diagnoses:  Viral upper respiratory tract infection   Visit with patient indicates symptoms comprised of nasal congestion, fever, chills, productive coughing, and postnasal drainage  since 06/25/23 congruent with acute URI that is likely viral in nature  Patient has tested negative for COVID and flu today in clinic  Due to nature and duration of symptoms recommended treatment regimen is symptomatic relief and follow up if needed Will send in Zofran to assist with nausea and vomiting and to encourage PO liquid intake.  Discussed with patient the various viral and bacterial etiologies of current illness and appropriate course of treatment Discussed OTC medication options for multisymptom relief such as Dayquil/Nyquil, Theraflu, AlkaSeltzer, etc. Discussed return precautions if symptoms are not improving or worsen over next 5-7 days.     Discharge Instructions      Your Flu and COVID testing was negative.   Based on your described symptoms and the duration of symptoms it is likely that you have a viral upper respiratory infection (often called a "cold")  Symptoms can last for 3-10 days with lingering cough and intermittent symptoms lasting weeks after that.  The goal of treatment at this time is to reduce your symptoms and discomfort    You can use over the counter medications such as Dayquil/Nyquil, AlkaSeltzer formulations, etc to provide further relief of symptoms according to the manufacturer's instructions  If preferred you can use regular Mucinex, Robitussin and Tylenol instead of the combo medications  I have sent in Zofran to help with your nausea and vomiting. This can be used as needed up to every 6 hours.    If your symptoms do not  improve or become worse in the next 5-7 days please make an apt at the office so we can see you  Go to the ER if you begin to have more serious symptoms such as shortness of breath, trouble breathing, loss of consciousness, swelling around the eyes, high fever, severe lasting headaches, vision changes or neck pain/stiffness.       ED Prescriptions     Medication Sig Dispense Auth. Provider   ondansetron (ZOFRAN-ODT) 4 MG disintegrating tablet Take 1 tablet (4 mg total) by mouth every 8 (eight) hours as needed for nausea or vomiting. 20 tablet Ora Bollig E, PA-C      PDMP not reviewed this encounter.   Marvis Bakken, Oswaldo Conroy, PA-C 06/27/23 1222

## 2023-06-27 NOTE — Discharge Instructions (Addendum)
Your flu testing was positive. I have sent in Tamiflu for you to start as soon as possible to help with your symptoms You can take OTC medications such as Theraflu, Alkaseltzer, and/or Dayquil/ Nyquil to further assist with managing your symptoms while the Tamiflu is kicking in.  Please follow up if you are not feeling better or start to feel worse in the next 5-7 days.

## 2023-06-27 NOTE — ED Triage Notes (Signed)
Patient here today with c/o cough, fever, body aches, chills, and sweats since Sunday. Her son recently tested positive Flu. She has been taking Theraflu and IBU with no relief.

## 2023-08-24 ENCOUNTER — Ambulatory Visit: Payer: BC Managed Care – PPO | Admitting: Radiology
